# Patient Record
Sex: Female | Born: 1985 | Hispanic: No | Marital: Married | Smoking: Never smoker
Health system: Southern US, Community
[De-identification: ages and names within clinical notes are randomized; demographics above are authoritative.]

## PROBLEM LIST (undated history)

## (undated) DIAGNOSIS — O139 Gestational [pregnancy-induced] hypertension without significant proteinuria, unspecified trimester: Secondary | ICD-10-CM

## (undated) DIAGNOSIS — D649 Anemia, unspecified: Secondary | ICD-10-CM

## (undated) DIAGNOSIS — K8020 Calculus of gallbladder without cholecystitis without obstruction: Secondary | ICD-10-CM

## (undated) DIAGNOSIS — R569 Unspecified convulsions: Secondary | ICD-10-CM

## (undated) DIAGNOSIS — O24419 Gestational diabetes mellitus in pregnancy, unspecified control: Secondary | ICD-10-CM

## (undated) HISTORY — PX: EYE SURGERY: SHX253

---
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
## 2003-04-08 ENCOUNTER — Inpatient Hospital Stay (HOSPITAL_COMMUNITY): Admission: AD | Admit: 2003-04-08 | Discharge: 2003-04-08 | Payer: Self-pay | Admitting: *Deleted

## 2013-06-07 ENCOUNTER — Encounter (HOSPITAL_COMMUNITY): Payer: Self-pay

## 2013-06-07 ENCOUNTER — Inpatient Hospital Stay (HOSPITAL_COMMUNITY): Payer: Medicaid Other

## 2013-06-07 ENCOUNTER — Inpatient Hospital Stay (HOSPITAL_COMMUNITY)
Admission: AD | Admit: 2013-06-07 | Discharge: 2013-06-12 | DRG: 765 | Disposition: A | Discharge status: Home / Self Care | Payer: Medicaid Other | Source: Ambulatory Visit | Attending: Obstetrics and Gynecology | Admitting: Obstetrics and Gynecology

## 2013-06-07 DIAGNOSIS — O1414 Severe pre-eclampsia complicating childbirth: Principal | ICD-10-CM | POA: Diagnosis present

## 2013-06-07 DIAGNOSIS — Z98891 History of uterine scar from previous surgery: Secondary | ICD-10-CM

## 2013-06-07 DIAGNOSIS — O99814 Abnormal glucose complicating childbirth: Secondary | ICD-10-CM | POA: Diagnosis present

## 2013-06-07 DIAGNOSIS — O1490 Unspecified pre-eclampsia, unspecified trimester: Secondary | ICD-10-CM | POA: Diagnosis present

## 2013-06-07 DIAGNOSIS — O9902 Anemia complicating childbirth: Secondary | ICD-10-CM | POA: Diagnosis present

## 2013-06-07 DIAGNOSIS — D649 Anemia, unspecified: Secondary | ICD-10-CM | POA: Diagnosis present

## 2013-06-07 DIAGNOSIS — O34219 Maternal care for unspecified type scar from previous cesarean delivery: Secondary | ICD-10-CM | POA: Diagnosis present

## 2013-06-07 DIAGNOSIS — O321XX0 Maternal care for breech presentation, not applicable or unspecified: Secondary | ICD-10-CM | POA: Diagnosis present

## 2013-06-07 LAB — COMPREHENSIVE METABOLIC PANEL
ALBUMIN: 2.6 g/dL — AB (ref 3.5–5.2)
ALK PHOS: 108 U/L (ref 39–117)
ALT: 14 U/L (ref 0–35)
ALT: 16 U/L (ref 0–35)
AST: 16 U/L (ref 0–37)
AST: 17 U/L (ref 0–37)
Albumin: 3 g/dL — ABNORMAL LOW (ref 3.5–5.2)
Alkaline Phosphatase: 95 U/L (ref 39–117)
BUN: 6 mg/dL (ref 6–23)
BUN: 6 mg/dL (ref 6–23)
CALCIUM: 8.6 mg/dL (ref 8.4–10.5)
CO2: 19 mEq/L (ref 19–32)
CO2: 18 meq/L — AB (ref 19–32)
Calcium: 8.4 mg/dL (ref 8.4–10.5)
Chloride: 105 mEq/L (ref 96–112)
Chloride: 103 mEq/L (ref 96–112)
Creatinine, Ser: 0.39 mg/dL — ABNORMAL LOW (ref 0.50–1.10)
Creatinine, Ser: 0.49 mg/dL — ABNORMAL LOW (ref 0.50–1.10)
GFR calc Af Amer: >90 mL/min (ref >90)
GFR calc Af Amer: >90 mL/min (ref >90)
GFR calc non Af Amer: >90 mL/min (ref >90)
GLUCOSE: 169 mg/dL — AB (ref 70–99)
Glucose, Bld: 151 mg/dL — ABNORMAL HIGH (ref 70–99)
POTASSIUM: 4.1 meq/L (ref 3.7–5.3)
Potassium: 3.6 mEq/L — ABNORMAL LOW (ref 3.7–5.3)
SODIUM: 138 meq/L (ref 137–147)
SODIUM: 137 meq/L (ref 137–147)
TOTAL PROTEIN: 5.8 g/dL — AB (ref 6.0–8.3)
TOTAL PROTEIN: 6.7 g/dL (ref 6.0–8.3)
Total Bilirubin: <0.2 mg/dL — ABNORMAL LOW (ref 0.3–1.2)
Total Bilirubin: <0.2 mg/dL — ABNORMAL LOW (ref 0.3–1.2)

## 2013-06-07 LAB — PROTEIN / CREATININE RATIO, URINE
Creatinine, Urine: 30.97 mg/dL
PROTEIN CREATININE RATIO: 2.61 — AB (ref 0.00–0.15)
TOTAL PROTEIN, URINE: 80.9 mg/dL

## 2013-06-07 LAB — URINALYSIS, ROUTINE W REFLEX MICROSCOPIC
Bilirubin Urine: NEGATIVE
Glucose, UA: NEGATIVE mg/dL
Ketones, ur: NEGATIVE mg/dL
NITRITE: NEGATIVE
Protein, ur: 100 mg/dL — AB
SPECIFIC GRAVITY, URINE: 1.01 (ref 1.005–1.030)
UROBILINOGEN UA: 0.2 mg/dL (ref 0.0–1.0)
pH: 6.5 (ref 5.0–8.0)

## 2013-06-07 LAB — GLUCOSE, CAPILLARY
GLUCOSE-CAPILLARY: 122 mg/dL — AB (ref 70–99)
Glucose-Capillary: 90 mg/dL (ref 70–99)

## 2013-06-07 LAB — CBC
HEMATOCRIT: 32.4 % — AB (ref 36.0–46.0)
HEMATOCRIT: 34.7 % — AB (ref 36.0–46.0)
Hemoglobin: 10.9 g/dL — ABNORMAL LOW (ref 12.0–15.0)
Hemoglobin: 12 g/dL (ref 12.0–15.0)
MCH: 29.5 pg (ref 26.0–34.0)
MCH: 30.4 pg (ref 26.0–34.0)
MCHC: 33.6 g/dL (ref 30.0–36.0)
MCHC: 34.6 g/dL (ref 30.0–36.0)
MCV: 87.8 fL (ref 78.0–100.0)
MCV: 87.8 fL (ref 78.0–100.0)
Platelets: 186 10*3/uL (ref 150–400)
Platelets: 200 10*3/uL (ref 150–400)
RBC: 3.69 MIL/uL — ABNORMAL LOW (ref 3.87–5.11)
RBC: 3.95 MIL/uL (ref 3.87–5.11)
RDW: 12.5 % (ref 11.5–15.5)
RDW: 12.5 % (ref 11.5–15.5)
WBC: 5 10*3/uL (ref 4.0–10.5)
WBC: 7.7 10*3/uL (ref 4.0–10.5)

## 2013-06-07 LAB — URIC ACID
Uric Acid, Serum: 5.4 mg/dL (ref 2.4–7.0)
Uric Acid, Serum: 6.1 mg/dL (ref 2.4–7.0)

## 2013-06-07 LAB — LACTATE DEHYDROGENASE: LDH: 137 U/L (ref 94–250)

## 2013-06-07 LAB — TYPE AND SCREEN
ABO/RH(D): O POS
ANTIBODY SCREEN: NEGATIVE

## 2013-06-07 LAB — URINE MICROSCOPIC-ADD ON

## 2013-06-07 LAB — ABO/RH: ABO/RH(D): O POS

## 2013-06-07 MED ORDER — LABETALOL HCL 5 MG/ML IV SOLN
10.0000 mg | INTRAVENOUS | Status: DC | PRN
  Filled 2013-06-07: qty 4

## 2013-06-07 MED ORDER — ZOLPIDEM TARTRATE 5 MG PO TABS: 5.0000 mg | ORAL_TABLET | Freq: Every evening | ORAL | Status: DC | PRN

## 2013-06-07 MED ORDER — MAGNESIUM SULFATE 4000MG/100ML IJ SOLN
4.0000 g | Freq: Once | INTRAMUSCULAR | Status: DC
  Filled 2013-06-07: qty 100

## 2013-06-07 MED ORDER — DOCUSATE SODIUM 100 MG PO CAPS
100.0000 mg | ORAL_CAPSULE | Freq: Every day | ORAL | Status: DC
  Administered 2013-06-08: 100 mg via ORAL
  Filled 2013-06-07 (×3): qty 1

## 2013-06-07 MED ORDER — MAGNESIUM SULFATE BOLUS VIA INFUSION
4.0000 g | Freq: Once | INTRAVENOUS | Status: AC
  Administered 2013-06-07: 4 g via INTRAVENOUS
  Filled 2013-06-07: qty 500

## 2013-06-07 MED ORDER — LABETALOL HCL 5 MG/ML IV SOLN
10.0000 mg | INTRAVENOUS | Status: DC | PRN
  Administered 2013-06-07: 10 mg via INTRAVENOUS

## 2013-06-07 MED ORDER — ACETAMINOPHEN 325 MG PO TABS: 650.0000 mg | ORAL_TABLET | ORAL | Status: DC | PRN

## 2013-06-07 MED ORDER — PRENATAL MULTIVITAMIN CH
1.0000 | ORAL_TABLET | Freq: Every day | ORAL | Status: DC
  Administered 2013-06-08: 1 via ORAL
  Filled 2013-06-07 (×3): qty 1

## 2013-06-07 MED ORDER — INSULIN ASPART 100 UNIT/ML ~~LOC~~ SOLN
0.0000 [IU] | Freq: Three times a day (TID) | SUBCUTANEOUS | Status: DC
Start: 2013-06-07 — End: 2013-06-08

## 2013-06-07 MED ORDER — BETAMETHASONE SOD PHOS & ACET 6 (3-3) MG/ML IJ SUSP
12.0000 mg | INTRAMUSCULAR | Status: AC
  Administered 2013-06-07 – 2013-06-08 (×2): 12 mg via INTRAMUSCULAR
  Filled 2013-06-07 (×2): qty 2

## 2013-06-07 MED ORDER — CALCIUM CARBONATE ANTACID 500 MG PO CHEW
2.0000 | CHEWABLE_TABLET | ORAL | Status: DC | PRN
  Filled 2013-06-07: qty 2

## 2013-06-07 MED ORDER — LACTATED RINGERS IV SOLN
INTRAVENOUS | Status: DC
  Administered 2013-06-07 – 2013-06-09 (×3): via INTRAVENOUS

## 2013-06-07 MED ORDER — MAGNESIUM SULFATE 40 G IN LACTATED RINGERS - SIMPLE
2.0000 g/h | INTRAVENOUS | Status: DC
  Administered 2013-06-08 – 2013-06-09 (×2): 2 g/h via INTRAVENOUS
  Filled 2013-06-07 (×3): qty 500

## 2013-06-07 MED ORDER — INSULIN ASPART 100 UNIT/ML ~~LOC~~ SOLN: 0.0000 [IU] | Freq: Every day | SUBCUTANEOUS | Status: DC

## 2013-06-07 NOTE — Progress Notes (Signed)
Reviewed findings of severe pre-eclampsia and plan of care with patient and niece. Questions answered   Cesarean section also reviewed with pt with R&B including but not limited to:  bleeding, infection, injury to other organs. Low transverse approach planned which will allow vaginal delivery with future pregnancies. Should a vertical incision or inverted T be needed, patient is aware that repeat cesarean sections would be recommended in the future. Expected hospital stay and recovery also discussed.  Will follow closely with labs every 8 hours.

## 2013-06-07 NOTE — Consult Note (Signed)
MFM consult, Staff Note  Impressions:  1. Persistent severe range blood pressures with P:C ratio of 2.6 ---> severe preeclampsia 2. Breech presentation of fetus that is AGA (see [facility]) ? Summary of Recommendations: 1. Initiation of course of corticosteroids  2. Treat severe range blood pressures with antihypertensive IV (eg, labetalol)  3. CEFM  4. Initiate magnesium sulfate for maternal seizure prophylaxis and neonatal CP prophylaxis  5. Would assess this patient clinically frequently to detect evidence of either maternal or fetal deterioration, noting that expectant management would not be recommended if deterioration of either mom or fetus became apparent  6. Would assess LFTs/Cr/CBC every 8 hours at a minimum given patient's symptoms to facilitate early detection of maternal end organ failure. If Cr is at or above 1.2, delivery would be recommended. If platelet count becomes less than 100,000, delivery would be recommended. If patient becomes hypoglycemic and transaminitis becomes more progressive (eg, 5- to 10-fold current values)---would indicate acute fatty liver, I would recommend discontinuing expectant management. If maternal HTN is not amenable to control then I would recommend delivery.  7. If eclampsia is encountered, would recommend treatment of eclamptic seizure to stabilize the maternal-fetal unit. Then, once stabilization has been established, discontinue expectant management and deliver this patient.  8. Lastly, regardless of whether this mom and baby continue to remain stable, I would recommend delivery between 32 weeks weeks or shortly after steroid course is completed noting that my preference/recommendation would be for after completion of steroids, noting this remains contingent upon a reassuring fetal heart tracing and stable maternal condition.  I spent in excess of 30 minutes reviewing and evaluating this patient's case with greater than 50% of this time in face to  face discussion.  I spoke directly with Dr. Estanislado Pandyivard to relay my impression and recommendations.  Page with questions.  Merideth AbbeyJ. M. Denney, MD, MS, FACOG Assistant Professor, Maternal-Fetal Medicine

## 2013-06-07 NOTE — MAU Note (Signed)
Pt presents from office for PIH eval. Denies vaginal bleeding, discharge, and leaking of fluid. Reports good fetal movement.

## 2013-06-07 NOTE — H&P (Signed)
Katelyn Hinton is a 28 y.o. female, G1P0 at 31.6 weeks, presenting from office for elevated bp, pitting and facial edema, and HA.  Patient reports having headache for 3 days and attributes to allergies.  Patient also newly diagnosed with GDM.    Patient Active Problem List   Diagnosis Date Noted  . Preeclampsia 06/07/2013    History of present pregnancy: Patient entered care at 12.6 weeks.   EDC of 08/03/2013 was established by 12.6wk US on 01/25/2014.   Anatomy scan:  20.1weeks, with normal findings and an posterior placenta.   Additional US evaluations:  06/07/2013--Breech, Right lateral above cervical os Significant prenatal events:  GDM Last evaluation:  06/07/2013  OB History   Grav Para Term Preterm Abortions TAB SAB Ect Mult Living   1              History reviewed. No pertinent past medical history. Past Surgical History  Procedure Laterality Date  . Eye surgery     Family History: family history includes Arthritis in her father; Diabetes in her father; Hypertension in her father and mother. Social History:  reports that she has never smoked. She does not have any smokeless tobacco history on file. She reports that she does not drink alcohol or use illicit drugs.   Prenatal Transfer Tool  Maternal Diabetes: Yes:  Diabetes Type:  Diet controlled Genetic Screening: Normal Maternal Ultrasounds/Referrals: Normal Fetal Ultrasounds or other Referrals:  None Maternal Substance Abuse:  No Significant Maternal Medications:  None Significant Maternal Lab Results: None  ROS:  WNL  No Known Allergies    Blood pressure 161/104, pulse 92, temperature 98.1 F (36.7 C), resp. rate 18, SpO2 100.00%.  Chest clear Heart RRR without murmur Abd gravid, NT Ext: WNL  FHR: Cat I  UCs:  Irregular, mild  Prenatal labs: ABO, Rh:  O Positive Antibody:  Negative Rubella:   Immune RPR:   NR HBsAg:   Negtive HIV:   NR GBS:  Not Obtained Sickle cell/Hgb electrophoresis:  Normal   Pap:  WNL 01/2013 GC:  Negative Chlamydia:  Negative Genetic screenings:   Glucola:  Positive-GDM-Diet Controlled Other:  PC Ratio-2.61    Assessment IUP at 31.6wks PreEclampsia GDM-New Diagnosed  Plan: Admit to antenatal per consult with Dr. Kathie RhodesS. Rivard  Start betamethasone dosage CBGs 4x daily with insulin sliding scale MFM Consult with US IV Labetalol order Continuous monitoring Will modify POC based on patient status and MFM recommendation   Peterson LombardJessica EmlyCNM, MSN 06/07/2013, 3:00 PM

## 2013-06-07 NOTE — Plan of Care (Signed)
Problem: Consults Goal: Birthing Suites Patient Information Press F2 to bring up selections list   Pt < [redacted] weeks EGA     

## 2013-06-07 NOTE — Consult Note (Signed)
Neonatology Consult to Antenatal Patient:  I was asked by Dr. Estanislado Pandyivard to see this patient in order to provide antenatal counseling due to severe pre-eclampsia.  [name] was admitted this afternoon at 31 2/[redacted] weeks GA due to severe pre-eclampsia. She is also an insulin-dependent GDM. She is currently not having active labor. She is getting BMZ and Magnesium sulfate. This is her first baby; the infant is AGA and is female. MFM is following.  I spoke with the patient and her neice. We discussed the worst case of delivery in the next 1-2 days, including usual DR management, possible respiratory complications and need for support, IV access, feedings (mother desires breast feeding, which was encouraged), LOS, Mortality and Morbidity, and long term outcomes. She is quite anxious and does not have any questions at this time. I offered a NICU tour to any interested family members and would be glad to come back if she has more questions later.  Thank you for asking me to see this patient.  Doretha Souhristie C. Adolphe Fortunato, MD Neonatologist  The total length of face-to-face or floor/unit time for this encounter was 20 minutes. Counseling and/or coordination of care was 15 minutes of the above.

## 2013-06-08 LAB — COMPREHENSIVE METABOLIC PANEL
ALBUMIN: 2.7 g/dL — AB (ref 3.5–5.2)
ALBUMIN: 3.2 g/dL — AB (ref 3.5–5.2)
ALK PHOS: 96 U/L (ref 39–117)
ALT: 15 U/L (ref 0–35)
ALT: 17 U/L (ref 0–35)
ALT: 17 U/L (ref 0–35)
AST: 17 U/L (ref 0–37)
AST: 19 U/L (ref 0–37)
AST: 17 U/L (ref 0–37)
Albumin: 2.7 g/dL — ABNORMAL LOW (ref 3.5–5.2)
Alkaline Phosphatase: 97 U/L (ref 39–117)
Alkaline Phosphatase: 99 U/L (ref 39–117)
BUN: 7 mg/dL (ref 6–23)
BUN: 6 mg/dL (ref 6–23)
BUN: 8 mg/dL (ref 6–23)
CALCIUM: 8 mg/dL — AB (ref 8.4–10.5)
CALCIUM: 7.6 mg/dL — AB (ref 8.4–10.5)
CO2: 19 meq/L (ref 19–32)
CO2: 18 mEq/L — ABNORMAL LOW (ref 19–32)
CO2: 19 meq/L (ref 19–32)
CREATININE: 0.43 mg/dL — AB (ref 0.50–1.10)
Calcium: 8.1 mg/dL — ABNORMAL LOW (ref 8.4–10.5)
Chloride: 103 mEq/L (ref 96–112)
Chloride: 103 mEq/L (ref 96–112)
Chloride: 103 mEq/L (ref 96–112)
Creatinine, Ser: 0.44 mg/dL — ABNORMAL LOW (ref 0.50–1.10)
Creatinine, Ser: 0.45 mg/dL — ABNORMAL LOW (ref 0.50–1.10)
GFR calc Af Amer: >90 mL/min (ref >90)
GFR calc non Af Amer: >90 mL/min (ref >90)
GFR calc non Af Amer: >90 mL/min (ref >90)
GLUCOSE: 181 mg/dL — AB (ref 70–99)
Glucose, Bld: 130 mg/dL — ABNORMAL HIGH (ref 70–99)
Glucose, Bld: 121 mg/dL — ABNORMAL HIGH (ref 70–99)
POTASSIUM: 4.3 meq/L (ref 3.7–5.3)
POTASSIUM: 4.2 meq/L (ref 3.7–5.3)
Potassium: 4.5 mEq/L (ref 3.7–5.3)
SODIUM: 136 meq/L — AB (ref 137–147)
SODIUM: 137 meq/L (ref 137–147)
Sodium: 136 mEq/L — ABNORMAL LOW (ref 137–147)
TOTAL PROTEIN: 6.4 g/dL (ref 6.0–8.3)
Total Bilirubin: <0.2 mg/dL — ABNORMAL LOW (ref 0.3–1.2)
Total Bilirubin: 0.3 mg/dL (ref 0.3–1.2)
Total Bilirubin: <0.2 mg/dL — ABNORMAL LOW (ref 0.3–1.2)
Total Protein: 6 g/dL (ref 6.0–8.3)
Total Protein: 6.3 g/dL (ref 6.0–8.3)

## 2013-06-08 LAB — GLUCOSE, CAPILLARY
Glucose-Capillary: 108 mg/dL — ABNORMAL HIGH (ref 70–99)
Glucose-Capillary: 112 mg/dL — ABNORMAL HIGH (ref 70–99)
Glucose-Capillary: 174 mg/dL — ABNORMAL HIGH (ref 70–99)
Glucose-Capillary: 176 mg/dL — ABNORMAL HIGH (ref 70–99)

## 2013-06-08 LAB — CBC
HCT: 34.1 % — ABNORMAL LOW (ref 36.0–46.0)
HCT: 31 % — ABNORMAL LOW (ref 36.0–46.0)
HEMATOCRIT: 32.3 % — AB (ref 36.0–46.0)
HEMOGLOBIN: 10.1 g/dL — AB (ref 12.0–15.0)
Hemoglobin: 11 g/dL — ABNORMAL LOW (ref 12.0–15.0)
Hemoglobin: 11.4 g/dL — ABNORMAL LOW (ref 12.0–15.0)
MCH: 29.7 pg (ref 26.0–34.0)
MCH: 29.5 pg (ref 26.0–34.0)
MCH: 28.7 pg (ref 26.0–34.0)
MCHC: 34.1 g/dL (ref 30.0–36.0)
MCHC: 33.4 g/dL (ref 30.0–36.0)
MCHC: 32.6 g/dL (ref 30.0–36.0)
MCV: 87.3 fL (ref 78.0–100.0)
MCV: 88.1 fL (ref 78.0–100.0)
MCV: 88.1 fL (ref 78.0–100.0)
Platelets: 217 10*3/uL (ref 150–400)
Platelets: 221 10*3/uL (ref 150–400)
Platelets: 209 10*3/uL (ref 150–400)
RBC: 3.7 MIL/uL — AB (ref 3.87–5.11)
RBC: 3.87 MIL/uL (ref 3.87–5.11)
RBC: 3.52 MIL/uL — ABNORMAL LOW (ref 3.87–5.11)
RDW: 12.6 % (ref 11.5–15.5)
RDW: 12.7 % (ref 11.5–15.5)
RDW: 12.8 % (ref 11.5–15.5)
WBC: 6.4 10*3/uL (ref 4.0–10.5)
WBC: 7.8 10*3/uL (ref 4.0–10.5)
WBC: 7.2 10*3/uL (ref 4.0–10.5)

## 2013-06-08 LAB — URIC ACID
URIC ACID, SERUM: 5.8 mg/dL (ref 2.4–7.0)
Uric Acid, Serum: 6 mg/dL (ref 2.4–7.0)
Uric Acid, Serum: 6 mg/dL (ref 2.4–7.0)

## 2013-06-08 MED ORDER — INSULIN ASPART 100 UNIT/ML ~~LOC~~ SOLN
0.0000 [IU] | Freq: Three times a day (TID) | SUBCUTANEOUS | Status: DC
  Administered 2013-06-08: 4 [IU] via SUBCUTANEOUS

## 2013-06-08 MED ORDER — SODIUM CHLORIDE 0.9 % IV SOLN
INTRAVENOUS | Status: DC
  Administered 2013-06-09: 0.8 [IU]/h via INTRAVENOUS
  Filled 2013-06-08: qty 1

## 2013-06-08 MED ORDER — LACTATED RINGERS IV BOLUS (SEPSIS)
300.0000 mL | Freq: Once | INTRAVENOUS | Status: AC
  Administered 2013-06-08: 300 mL via INTRAVENOUS

## 2013-06-08 MED ORDER — INSULIN ASPART 100 UNIT/ML ~~LOC~~ SOLN: 0.0000 [IU] | Freq: Three times a day (TID) | SUBCUTANEOUS | Status: DC

## 2013-06-08 MED ORDER — DEXTROSE IN LACTATED RINGERS 5 % IV SOLN
INTRAVENOUS | Status: DC
  Administered 2013-06-09: 03:00:00 via INTRAVENOUS

## 2013-06-08 NOTE — Progress Notes (Addendum)
Antenatal Nutrition Assessment:  Currently  [redacted] weeks gestation, with severe preeclampsia. Height  61 "  Weight 176 lbs  pre-pregnancy weight 163 lbs .  Pre-pregnancy  BMI 30.8  IBW 105 lbs Total weight gain 13.lbs Weight gain goals 11-20 lbs Estimated needs: 17-1900 kcal/day, 59-69 grams protein/day, 2 liters fluid/day  Gestational carbohydrate modified diet  Current diet prescription will provide for increased needs.  Nutrition related labs . Betamethasone w/ insulin coverage for elevated CBG's CBG (last 3)   Recent Labs  06/07/13 1827 06/07/13 2213  GLUCAP 90 122*      Nutrition Dx: Increased nutrient needs r/t pregnancy and fetal growth requirements aeb [redacted] weeks gestation.  No educational needs assessed at this time.  Elisabeth CaraKatherine Brigham M.Odis LusterEd. R.D. LDN Neonatal Nutrition Support Specialist Pager [phone] Continue current care.  Plan for cesarean in the am

## 2013-06-08 NOTE — Progress Notes (Addendum)
Hospital day # 1 pregnancy at [redacted]w[redacted]d--Severe pre-eclampsia, breech presentation, gestational diabetes.  S:  Slept poorly, but denies HA, visual sx, or epigastric pain.  No SOB or chest         Pain.  Tolerating magnesium without difficulty .       Reports feels better than prior to admission.      Perception of contractions: None      Vaginal bleeding: None      Vaginal discharge:  None  O: BP 126/77  Pulse 101  Temp(Src) 98.1 F (36.7 C) (Oral)  Resp 18  Ht 5\' 1"  (1.549 m)  Wt 176 lb (79.833 kg)  BMI 33.27 kg/m2  SpO2 100%      Fetal tracings:  Category 1      Contractions:   None      Uterus gravid and non-tender      Extremities: DTR 1+, no clonus, 1-2+ edema in LE/feet  BP range since [place]:  126-134/76-88  BP range from 5pm to MN:  134-162/89-98          Labs:  Results for orders placed during the hospital encounter of 06/07/13 (from the past 24 hour(s))  URINALYSIS, ROUTINE W REFLEX MICROSCOPIC     Status: Abnormal   Collection Time    06/07/13 11:35 AM      Result Value Ref Range   Color, Urine YELLOW  YELLOW   APPearance CLEAR  CLEAR   Specific Gravity, Urine 1.010  1.005 - 1.030   pH 6.5  5.0 - 8.0   Glucose, UA NEGATIVE  NEGATIVE mg/dL   Hgb urine dipstick TRACE (*) NEGATIVE   Bilirubin Urine NEGATIVE  NEGATIVE   Ketones, ur NEGATIVE  NEGATIVE mg/dL   Protein, ur 161 (*) NEGATIVE mg/dL   Urobilinogen, UA 0.2  0.0 - 1.0 mg/dL   Nitrite NEGATIVE  NEGATIVE   Leukocytes, UA MODERATE (*) NEGATIVE  PROTEIN / CREATININE RATIO, URINE     Status: Abnormal   Collection Time    06/07/13 11:35 AM      Result Value Ref Range   Creatinine, Urine 30.97     Total Protein, Urine 80.9     PROTEIN CREATININE RATIO 2.61 (*) 0.00 - 0.15  URINE MICROSCOPIC-ADD ON     Status: Abnormal   Collection Time    06/07/13 11:35 AM      Result Value Ref Range   Squamous Epithelial / LPF FEW (*) RARE   WBC, UA 7-10  <3 WBC/hpf   RBC / HPF 3-6  <3 RBC/hpf   Bacteria, UA RARE  RARE   COMPREHENSIVE METABOLIC PANEL     Status: Abnormal   Collection Time    06/07/13 11:56 AM      Result Value Ref Range   Sodium 138  137 - 147 mEq/L   Potassium 3.6 (*) 3.7 - 5.3 mEq/L   Chloride 105  96 - 112 mEq/L   CO2 19  19 - 32 mEq/L   Glucose, Bld 151 (*) 70 - 99 mg/dL   BUN 6  6 - 23 mg/dL   Creatinine, Ser 0.96 (*) 0.50 - 1.10 mg/dL   Calcium 8.4  8.4 - 04.5 mg/dL   Total Protein 5.8 (*) 6.0 - 8.3 g/dL   Albumin 2.6 (*) 3.5 - 5.2 g/dL   AST 16  0 - 37 U/L   ALT 14  0 - 35 U/L   Alkaline Phosphatase 95  39 - 117 U/L   Total  Bilirubin <0.2 (*) 0.3 - 1.2 mg/dL   GFR calc non Af Amer >90  >90 mL/min   GFR calc Af Amer >90  >90 mL/min  CBC     Status: Abnormal   Collection Time    06/07/13 11:56 AM      Result Value Ref Range   WBC 5.0  4.0 - 10.5 K/uL   RBC 3.69 (*) 3.87 - 5.11 MIL/uL   Hemoglobin 10.9 (*) 12.0 - 15.0 g/dL   HCT 16.1 (*) 09.6 - 04.5 %   MCV 87.8  78.0 - 100.0 fL   MCH 29.5  26.0 - 34.0 pg   MCHC 33.6  30.0 - 36.0 g/dL   RDW 40.9  81.1 - 91.4 %   Platelets 186  150 - 400 K/uL  URIC ACID     Status: None   Collection Time    06/07/13 11:56 AM      Result Value Ref Range   Uric Acid, Serum 5.4  2.4 - 7.0 mg/dL  LACTATE DEHYDROGENASE     Status: None   Collection Time    06/07/13 11:56 AM      Result Value Ref Range   LDH 137  94 - 250 U/L  GLUCOSE, CAPILLARY     Status: None   Collection Time    06/07/13  6:27 PM      Result Value Ref Range   Glucose-Capillary 90  70 - 99 mg/dL  CBC     Status: Abnormal   Collection Time    06/07/13  8:05 PM      Result Value Ref Range   WBC 7.7  4.0 - 10.5 K/uL   RBC 3.95  3.87 - 5.11 MIL/uL   Hemoglobin 12.0  12.0 - 15.0 g/dL   HCT 78.2 (*) 95.6 - 21.3 %   MCV 87.8  78.0 - 100.0 fL   MCH 30.4  26.0 - 34.0 pg   MCHC 34.6  30.0 - 36.0 g/dL   RDW 08.6  57.8 - 46.9 %   Platelets 200  150 - 400 K/uL  COMPREHENSIVE METABOLIC PANEL     Status: Abnormal   Collection Time    06/07/13  8:05 PM      Result  Value Ref Range   Sodium 137  137 - 147 mEq/L   Potassium 4.1  3.7 - 5.3 mEq/L   Chloride 103  96 - 112 mEq/L   CO2 18 (*) 19 - 32 mEq/L   Glucose, Bld 169 (*) 70 - 99 mg/dL   BUN 6  6 - 23 mg/dL   Creatinine, Ser 6.29 (*) 0.50 - 1.10 mg/dL   Calcium 8.6  8.4 - 52.8 mg/dL   Total Protein 6.7  6.0 - 8.3 g/dL   Albumin 3.0 (*) 3.5 - 5.2 g/dL   AST 17  0 - 37 U/L   ALT 16  0 - 35 U/L   Alkaline Phosphatase 108  39 - 117 U/L   Total Bilirubin <0.2 (*) 0.3 - 1.2 mg/dL   GFR calc non Af Amer >90  >90 mL/min   GFR calc Af Amer >90  >90 mL/min  URIC ACID     Status: None   Collection Time    06/07/13  8:05 PM      Result Value Ref Range   Uric Acid, Serum 6.1  2.4 - 7.0 mg/dL  TYPE AND SCREEN     Status: None   Collection Time    06/07/13  8:05 PM      Result Value Ref Range   ABO/RH(D) O POS     Antibody Screen NEG     Sample Expiration 06/10/2013    ABO/RH     Status: None   Collection Time    06/07/13  8:05 PM      Result Value Ref Range   ABO/RH(D) O POS    GLUCOSE, CAPILLARY     Status: Abnormal   Collection Time    06/07/13 10:13 PM      Result Value Ref Range   Glucose-Capillary 122 (*) 70 - 99 mg/dL  CBC     Status: Abnormal   Collection Time    06/08/13  2:55 AM      Result Value Ref Range   WBC 6.4  4.0 - 10.5 K/uL   RBC 3.70 (*) 3.87 - 5.11 MIL/uL   Hemoglobin 11.0 (*) 12.0 - 15.0 g/dL   HCT 16.132.3 (*) 09.636.0 - 04.546.0 %   MCV 87.3  78.0 - 100.0 fL   MCH 29.7  26.0 - 34.0 pg   MCHC 34.1  30.0 - 36.0 g/dL   RDW 40.912.6  81.111.5 - 91.415.5 %   Platelets 217  150 - 400 K/uL  COMPREHENSIVE METABOLIC PANEL     Status: Abnormal   Collection Time    06/08/13  2:55 AM      Result Value Ref Range   Sodium 136 (*) 137 - 147 mEq/L   Potassium 4.5  3.7 - 5.3 mEq/L   Chloride 103  96 - 112 mEq/L   CO2 19  19 - 32 mEq/L   Glucose, Bld 130 (*) 70 - 99 mg/dL   BUN 7  6 - 23 mg/dL   Creatinine, Ser 7.820.43 (*) 0.50 - 1.10 mg/dL   Calcium 8.0 (*) 8.4 - 10.5 mg/dL   Total Protein 6.0  6.0  - 8.3 g/dL   Albumin 2.7 (*) 3.5 - 5.2 g/dL   AST 17  0 - 37 U/L   ALT 15  0 - 35 U/L   Alkaline Phosphatase 97  39 - 117 U/L   Total Bilirubin <0.2 (*) 0.3 - 1.2 mg/dL   GFR calc non Af Amer >90  >90 mL/min   GFR calc Af Amer >90  >90 mL/min  URIC ACID     Status: None   Collection Time    06/08/13  2:55 AM      Result Value Ref Range   Uric Acid, Serum 5.8  2.4 - 7.0 mg/dL    I/O:  24 hour balance +1425, output last shift 1200 cc.       Meds: Magnesium sulfate 2 g/hr--started with 4 gm bolus at 1710                 Betamethasone 1st dose at 1500 yesterday                 Sliding scale insulin--has not required administration                 Labetalol IV prn parameters--received 10 mg dose at 1647  A: [id number] with severe pre-eclampsia, gestational diabetes, breech presentation     Stable labs and BPs  P: Continue current plan of care      Upcoming tests/treatments:  PIH labs q 8 hours per MFM consult--next draw       at 1055      2nd dose betamethasone at 1500.  Continue q 8 hour PIH labs      CBGs currently AC and HS--will consult Dr. Normand Sloopillard re: timing (? FBS and 2        hour PC vs current plan).      Scheduled for primary C/S at 1pm tomorrow      Reviewed plan of care with patient, including plan for C/S tomorrow.             Discussed rationale for not performing external version, in light of severe       pre-eclampsia and pre-term gestation.      MDs will follow--will consult regarding timing of NPO, etc, in light of GDM.  Nigel BridgemanVicki Maxwel Meadowcroft CNM, [place] 06/08/2013 8:05 AM  Addendum: Per consult with Dr. Normand Sloopillard: CBGs FBS and 2 hour PC NPO after 3am 06/09/13 Initiate glucommander regimen when NPO  Nigel BridgemanVicki Jaquari Reckner, CNM 06/08/13 [phone]

## 2013-06-09 ENCOUNTER — Encounter (HOSPITAL_COMMUNITY): Payer: Self-pay

## 2013-06-09 ENCOUNTER — Inpatient Hospital Stay (HOSPITAL_COMMUNITY): Payer: Medicaid Other | Admitting: Registered Nurse

## 2013-06-09 ENCOUNTER — Encounter (HOSPITAL_COMMUNITY)
Admission: AD | Disposition: A | Discharge status: Home / Self Care | Payer: Self-pay | Source: Ambulatory Visit | Attending: Obstetrics and Gynecology

## 2013-06-09 ENCOUNTER — Encounter (HOSPITAL_COMMUNITY): Payer: Medicaid Other | Admitting: Registered Nurse

## 2013-06-09 LAB — RPR

## 2013-06-09 LAB — COMPREHENSIVE METABOLIC PANEL
ALK PHOS: 104 U/L (ref 39–117)
ALK PHOS: 73 U/L (ref 39–117)
ALT: 20 U/L (ref 0–35)
ALT: 17 U/L (ref 0–35)
ALT: 18 U/L (ref 0–35)
AST: 19 U/L (ref 0–37)
AST: 19 U/L (ref 0–37)
AST: 21 U/L (ref 0–37)
Albumin: 3 g/dL — ABNORMAL LOW (ref 3.5–5.2)
Albumin: 2.7 g/dL — ABNORMAL LOW (ref 3.5–5.2)
Albumin: 2.4 g/dL — ABNORMAL LOW (ref 3.5–5.2)
Alkaline Phosphatase: 86 U/L (ref 39–117)
BUN: 10 mg/dL (ref 6–23)
BUN: 10 mg/dL (ref 6–23)
BUN: 10 mg/dL (ref 6–23)
CHLORIDE: 103 meq/L (ref 96–112)
CHLORIDE: 104 meq/L (ref 96–112)
CHLORIDE: 103 meq/L (ref 96–112)
CO2: 19 meq/L (ref 19–32)
CO2: 22 meq/L (ref 19–32)
CO2: 23 mEq/L (ref 19–32)
CREATININE: 0.53 mg/dL (ref 0.50–1.10)
CREATININE: 0.43 mg/dL — AB (ref 0.50–1.10)
Calcium: 7.9 mg/dL — ABNORMAL LOW (ref 8.4–10.5)
Calcium: 7.3 mg/dL — ABNORMAL LOW (ref 8.4–10.5)
Calcium: 6.8 mg/dL — ABNORMAL LOW (ref 8.4–10.5)
Creatinine, Ser: 0.47 mg/dL — ABNORMAL LOW (ref 0.50–1.10)
GFR calc Af Amer: >90 mL/min (ref >90)
GFR calc Af Amer: >90 mL/min (ref >90)
Glucose, Bld: 173 mg/dL — ABNORMAL HIGH (ref 70–99)
Glucose, Bld: 103 mg/dL — ABNORMAL HIGH (ref 70–99)
Glucose, Bld: 97 mg/dL (ref 70–99)
Potassium: 4.3 mEq/L (ref 3.7–5.3)
Potassium: 4 mEq/L (ref 3.7–5.3)
Potassium: 4.1 mEq/L (ref 3.7–5.3)
SODIUM: 137 meq/L (ref 137–147)
Sodium: 139 mEq/L (ref 137–147)
Sodium: 138 mEq/L (ref 137–147)
TOTAL PROTEIN: 4.9 g/dL — AB (ref 6.0–8.3)
Total Bilirubin: <0.2 mg/dL — ABNORMAL LOW (ref 0.3–1.2)
Total Protein: 6.4 g/dL (ref 6.0–8.3)
Total Protein: 6.1 g/dL (ref 6.0–8.3)

## 2013-06-09 LAB — CBC
HCT: 32.8 % — ABNORMAL LOW (ref 36.0–46.0)
HCT: 30.2 % — ABNORMAL LOW (ref 36.0–46.0)
HCT: 24.6 % — ABNORMAL LOW (ref 36.0–46.0)
Hemoglobin: 10.8 g/dL — ABNORMAL LOW (ref 12.0–15.0)
Hemoglobin: 10.4 g/dL — ABNORMAL LOW (ref 12.0–15.0)
Hemoglobin: 8.3 g/dL — ABNORMAL LOW (ref 12.0–15.0)
MCH: 29.1 pg (ref 26.0–34.0)
MCH: 30.3 pg (ref 26.0–34.0)
MCH: 29.6 pg (ref 26.0–34.0)
MCHC: 32.9 g/dL (ref 30.0–36.0)
MCHC: 34.4 g/dL (ref 30.0–36.0)
MCHC: 33.7 g/dL (ref 30.0–36.0)
MCV: 88.4 fL (ref 78.0–100.0)
MCV: 88 fL (ref 78.0–100.0)
MCV: 87.9 fL (ref 78.0–100.0)
Platelets: 210 10*3/uL (ref 150–400)
Platelets: 190 10*3/uL (ref 150–400)
Platelets: 204 K/uL (ref 150–400)
RBC: 3.71 MIL/uL — AB (ref 3.87–5.11)
RBC: 3.43 MIL/uL — AB (ref 3.87–5.11)
RBC: 2.8 MIL/uL — ABNORMAL LOW (ref 3.87–5.11)
RDW: 12.8 % (ref 11.5–15.5)
RDW: 12.7 % (ref 11.5–15.5)
RDW: 12.8 % (ref 11.5–15.5)
WBC: 8.6 10*3/uL (ref 4.0–10.5)
WBC: 7.7 10*3/uL (ref 4.0–10.5)
WBC: 11.4 K/uL — ABNORMAL HIGH (ref 4.0–10.5)

## 2013-06-09 LAB — GLUCOSE, CAPILLARY
GLUCOSE-CAPILLARY: 112 mg/dL — AB (ref 70–99)
GLUCOSE-CAPILLARY: 92 mg/dL (ref 70–99)
GLUCOSE-CAPILLARY: 93 mg/dL (ref 70–99)
Glucose-Capillary: 140 mg/dL — ABNORMAL HIGH (ref 70–99)
Glucose-Capillary: 123 mg/dL — ABNORMAL HIGH (ref 70–99)
Glucose-Capillary: 111 mg/dL — ABNORMAL HIGH (ref 70–99)
Glucose-Capillary: 119 mg/dL — ABNORMAL HIGH (ref 70–99)
Glucose-Capillary: 118 mg/dL — ABNORMAL HIGH (ref 70–99)
Glucose-Capillary: 123 mg/dL — ABNORMAL HIGH (ref 70–99)
Glucose-Capillary: 101 mg/dL — ABNORMAL HIGH (ref 70–99)
Glucose-Capillary: 96 mg/dL (ref 70–99)
Glucose-Capillary: 89 mg/dL (ref 70–99)
Glucose-Capillary: 102 mg/dL — ABNORMAL HIGH (ref 70–99)
Glucose-Capillary: 91 mg/dL (ref 70–99)
Glucose-Capillary: 81 mg/dL (ref 70–99)
Glucose-Capillary: 83 mg/dL (ref 70–99)

## 2013-06-09 LAB — MAGNESIUM: Magnesium: 4.7 mg/dL — ABNORMAL HIGH (ref 1.5–2.5)

## 2013-06-09 LAB — URIC ACID
URIC ACID, SERUM: 5.6 mg/dL (ref 2.4–7.0)
Uric Acid, Serum: 5.9 mg/dL (ref 2.4–7.0)
Uric Acid, Serum: 5.8 mg/dL (ref 2.4–7.0)

## 2013-06-09 LAB — LACTATE DEHYDROGENASE: LDH: 258 U/L — ABNORMAL HIGH (ref 94–250)

## 2013-06-09 SURGERY — Surgical Case
Anesthesia: Spinal | Site: Abdomen

## 2013-06-09 MED ORDER — FENTANYL CITRATE 0.05 MG/ML IJ SOLN
25.0000 ug | INTRAMUSCULAR | Status: DC | PRN
  Administered 2013-06-09 (×4): 50 ug via INTRAVENOUS

## 2013-06-09 MED ORDER — DIPHENHYDRAMINE HCL 50 MG/ML IJ SOLN: 12.5000 mg | INTRAMUSCULAR | Status: DC | PRN

## 2013-06-09 MED ORDER — NALOXONE HCL 0.4 MG/ML IJ SOLN: 0.4000 mg | INTRAMUSCULAR | Status: DC | PRN

## 2013-06-09 MED ORDER — MEPERIDINE HCL 25 MG/ML IJ SOLN: 6.2500 mg | INTRAMUSCULAR | Status: DC | PRN

## 2013-06-09 MED ORDER — KETOROLAC TROMETHAMINE 30 MG/ML IJ SOLN: 30.0000 mg | Freq: Four times a day (QID) | INTRAMUSCULAR | Status: AC | PRN

## 2013-06-09 MED ORDER — MORPHINE SULFATE 0.5 MG/ML IJ SOLN
INTRAMUSCULAR | Status: AC
  Filled 2013-06-09: qty 10

## 2013-06-09 MED ORDER — NALBUPHINE HCL 10 MG/ML IJ SOLN: 5.0000 mg | INTRAMUSCULAR | Status: DC | PRN

## 2013-06-09 MED ORDER — FENTANYL CITRATE 0.05 MG/ML IJ SOLN
INTRAMUSCULAR | Status: AC
  Administered 2013-06-09: 50 ug via INTRAVENOUS
  Filled 2013-06-09: qty 2

## 2013-06-09 MED ORDER — TETANUS-DIPHTH-ACELL PERTUSSIS 5-2.5-18.5 LF-MCG/0.5 IM SUSP
0.5000 mL | Freq: Once | INTRAMUSCULAR | Status: DC
  Filled 2013-06-09 (×2): qty 0.5

## 2013-06-09 MED ORDER — BUPIVACAINE IN DEXTROSE 0.75-8.25 % IT SOLN
INTRATHECAL | Status: DC | PRN
  Administered 2013-06-09: 1.2 mL via INTRATHECAL

## 2013-06-09 MED ORDER — OXYTOCIN 40 UNITS IN LACTATED RINGERS INFUSION - SIMPLE MED: 62.5000 mL/h | INTRAVENOUS | Status: AC

## 2013-06-09 MED ORDER — ONDANSETRON HCL 4 MG/2ML IJ SOLN: 4.0000 mg | INTRAMUSCULAR | Status: DC | PRN

## 2013-06-09 MED ORDER — CHLOROPROCAINE HCL 3 % IJ SOLN
INTRAMUSCULAR | Status: DC | PRN
  Administered 2013-06-09: 20 mL

## 2013-06-09 MED ORDER — METOCLOPRAMIDE HCL 5 MG/ML IJ SOLN: 10.0000 mg | Freq: Three times a day (TID) | INTRAMUSCULAR | Status: DC | PRN

## 2013-06-09 MED ORDER — IBUPROFEN 600 MG PO TABS
600.0000 mg | ORAL_TABLET | Freq: Four times a day (QID) | ORAL | Status: DC
  Administered 2013-06-09 – 2013-06-12 (×11): 600 mg via ORAL
  Filled 2013-06-09 (×11): qty 1

## 2013-06-09 MED ORDER — SODIUM CHLORIDE 0.9 % IJ SOLN
3.0000 mL | INTRAMUSCULAR | Status: DC | PRN
  Administered 2013-06-10: 3 mL via INTRAVENOUS

## 2013-06-09 MED ORDER — SIMETHICONE 80 MG PO CHEW
80.0000 mg | CHEWABLE_TABLET | Freq: Three times a day (TID) | ORAL | Status: DC
Start: 2013-06-09 — End: 2013-06-12
  Administered 2013-06-10 – 2013-06-12 (×7): 80 mg via ORAL
  Filled 2013-06-09 (×8): qty 1

## 2013-06-09 MED ORDER — CHLOROPROCAINE HCL 3 % IJ SOLN
INTRAMUSCULAR | Status: AC
  Filled 2013-06-09: qty 20

## 2013-06-09 MED ORDER — OXYTOCIN 10 UNIT/ML IJ SOLN
INTRAMUSCULAR | Status: AC
  Filled 2013-06-09: qty 4

## 2013-06-09 MED ORDER — INSULIN ASPART 100 UNIT/ML ~~LOC~~ SOLN: 2.0000 [IU] | SUBCUTANEOUS | Status: DC

## 2013-06-09 MED ORDER — METOCLOPRAMIDE HCL 5 MG/ML IJ SOLN: 10.0000 mg | Freq: Once | INTRAMUSCULAR | Status: DC | PRN

## 2013-06-09 MED ORDER — PRENATAL MULTIVITAMIN CH
1.0000 | ORAL_TABLET | Freq: Every day | ORAL | Status: DC
  Administered 2013-06-10 – 2013-06-12 (×3): 1 via ORAL
  Filled 2013-06-09 (×3): qty 1

## 2013-06-09 MED ORDER — DIPHENHYDRAMINE HCL 50 MG/ML IJ SOLN: 25.0000 mg | INTRAMUSCULAR | Status: DC | PRN

## 2013-06-09 MED ORDER — DIBUCAINE 1 % RE OINT: 1.0000 "application " | TOPICAL_OINTMENT | RECTAL | Status: DC | PRN

## 2013-06-09 MED ORDER — ONDANSETRON HCL 4 MG/2ML IJ SOLN
INTRAMUSCULAR | Status: DC | PRN
  Administered 2013-06-09: 4 mg via INTRAVENOUS

## 2013-06-09 MED ORDER — DIPHENHYDRAMINE HCL 25 MG PO CAPS: 25.0000 mg | ORAL_CAPSULE | Freq: Four times a day (QID) | ORAL | Status: DC | PRN

## 2013-06-09 MED ORDER — ZOLPIDEM TARTRATE 5 MG PO TABS: 5.0000 mg | ORAL_TABLET | Freq: Every evening | ORAL | Status: DC | PRN

## 2013-06-09 MED ORDER — SIMETHICONE 80 MG PO CHEW
80.0000 mg | CHEWABLE_TABLET | ORAL | Status: DC
  Administered 2013-06-09 – 2013-06-10 (×2): 80 mg via ORAL
  Filled 2013-06-09: qty 1

## 2013-06-09 MED ORDER — MORPHINE SULFATE (PF) 0.5 MG/ML IJ SOLN
INTRAMUSCULAR | Status: DC | PRN
  Administered 2013-06-09 (×2): 2 mg via EPIDURAL
  Administered 2013-06-09: .9 mg via EPIDURAL

## 2013-06-09 MED ORDER — SENNOSIDES-DOCUSATE SODIUM 8.6-50 MG PO TABS
2.0000 | ORAL_TABLET | ORAL | Status: DC
  Administered 2013-06-09 – 2013-06-10 (×2): 2 via ORAL
  Filled 2013-06-09: qty 2

## 2013-06-09 MED ORDER — SIMETHICONE 80 MG PO CHEW: 80.0000 mg | CHEWABLE_TABLET | ORAL | Status: DC | PRN

## 2013-06-09 MED ORDER — WITCH HAZEL-GLYCERIN EX PADS: 1.0000 "application " | MEDICATED_PAD | CUTANEOUS | Status: DC | PRN

## 2013-06-09 MED ORDER — ONDANSETRON HCL 4 MG/2ML IJ SOLN: 4.0000 mg | Freq: Three times a day (TID) | INTRAMUSCULAR | Status: DC | PRN

## 2013-06-09 MED ORDER — CEFAZOLIN SODIUM-DEXTROSE 2-3 GM-% IV SOLR: 2.0000 g | Freq: Three times a day (TID) | INTRAVENOUS | Status: DC

## 2013-06-09 MED ORDER — FENTANYL CITRATE 0.05 MG/ML IJ SOLN
INTRAMUSCULAR | Status: DC | PRN
  Administered 2013-06-09: 50 ug via INTRAVENOUS
  Administered 2013-06-09: 35 ug via INTRAVENOUS

## 2013-06-09 MED ORDER — LACTATED RINGERS IV SOLN
INTRAVENOUS | Status: DC
  Administered 2013-06-09 – 2013-06-10 (×2): 100 mL/h via INTRAVENOUS

## 2013-06-09 MED ORDER — NALOXONE HCL 1 MG/ML IJ SOLN: 1.0000 ug/kg/h | INTRAVENOUS | Status: DC | PRN

## 2013-06-09 MED ORDER — OXYTOCIN 10 UNIT/ML IJ SOLN
40.0000 [IU] | INTRAVENOUS | Status: DC | PRN
  Administered 2013-06-09: 40 [IU] via INTRAVENOUS

## 2013-06-09 MED ORDER — ONDANSETRON HCL 4 MG PO TABS: 4.0000 mg | ORAL_TABLET | ORAL | Status: DC | PRN

## 2013-06-09 MED ORDER — LANOLIN HYDROUS EX OINT: 1.0000 "application " | TOPICAL_OINTMENT | CUTANEOUS | Status: DC | PRN

## 2013-06-09 MED ORDER — PHENYLEPHRINE 8 MG IN D5W 100 ML (0.08MG/ML) PREMIX OPTIME
INJECTION | INTRAVENOUS | Status: AC
  Filled 2013-06-09: qty 100

## 2013-06-09 MED ORDER — OXYCODONE-ACETAMINOPHEN 5-325 MG PO TABS: 1.0000 | ORAL_TABLET | ORAL | Status: DC | PRN

## 2013-06-09 MED ORDER — KETOROLAC TROMETHAMINE 60 MG/2ML IM SOLN: 60.0000 mg | Freq: Once | INTRAMUSCULAR | Status: AC | PRN

## 2013-06-09 MED ORDER — MENTHOL 3 MG MT LOZG: 1.0000 | LOZENGE | OROMUCOSAL | Status: DC | PRN

## 2013-06-09 MED ORDER — FENTANYL CITRATE 0.05 MG/ML IJ SOLN
INTRAMUSCULAR | Status: DC | PRN
  Administered 2013-06-09: 15 ug via INTRATHECAL

## 2013-06-09 MED ORDER — MORPHINE SULFATE (PF) 0.5 MG/ML IJ SOLN
INTRAMUSCULAR | Status: DC | PRN
  Administered 2013-06-09: .1 mg via INTRATHECAL

## 2013-06-09 MED ORDER — IBUPROFEN 600 MG PO TABS: 600.0000 mg | ORAL_TABLET | Freq: Four times a day (QID) | ORAL | Status: DC

## 2013-06-09 MED ORDER — ONDANSETRON HCL 4 MG/2ML IJ SOLN
INTRAMUSCULAR | Status: AC
  Filled 2013-06-09: qty 2

## 2013-06-09 MED ORDER — PHENYLEPHRINE 8 MG IN D5W 100 ML (0.08MG/ML) PREMIX OPTIME
INJECTION | INTRAVENOUS | Status: DC | PRN
  Administered 2013-06-09: 15 ug/min via INTRAVENOUS

## 2013-06-09 MED ORDER — SCOPOLAMINE 1 MG/3DAYS TD PT72: 1.0000 | MEDICATED_PATCH | Freq: Once | TRANSDERMAL | Status: DC

## 2013-06-09 MED ORDER — CEFAZOLIN SODIUM-DEXTROSE 2-3 GM-% IV SOLR
2.0000 g | INTRAVENOUS | Status: AC
  Administered 2013-06-09: 2 g via INTRAVENOUS
  Filled 2013-06-09: qty 50

## 2013-06-09 MED ORDER — LACTATED RINGERS IV SOLN
INTRAVENOUS | Status: DC | PRN
  Administered 2013-06-09: 14:00:00 via INTRAVENOUS

## 2013-06-09 MED ORDER — DIPHENHYDRAMINE HCL 25 MG PO CAPS: 25.0000 mg | ORAL_CAPSULE | ORAL | Status: DC | PRN

## 2013-06-09 MED ORDER — FENTANYL CITRATE 0.05 MG/ML IJ SOLN
INTRAMUSCULAR | Status: AC
  Filled 2013-06-09: qty 2

## 2013-06-09 SURGICAL SUPPLY — 40 items
ADH SKN CLS APL DERMABOND .7 (GAUZE/BANDAGES/DRESSINGS) ×1
APL SKNCLS STERI-STRIP NONHPOA (GAUZE/BANDAGES/DRESSINGS) ×1
BENZOIN TINCTURE PRP APPL 2/3 (GAUZE/BANDAGES/DRESSINGS) ×2 IMPLANT
CLAMP CORD UMBIL (MISCELLANEOUS) IMPLANT
CLOTH BEACON ORANGE TIMEOUT ST (SAFETY) ×2 IMPLANT
CONTAINER PREFILL 10% NBF 15ML (MISCELLANEOUS) IMPLANT
DERMABOND ADVANCED (GAUZE/BANDAGES/DRESSINGS) ×1
DERMABOND ADVANCED .7 DNX12 (GAUZE/BANDAGES/DRESSINGS) IMPLANT
DRAIN JACKSON PRT FLT 7MM (DRAIN) ×1 IMPLANT
DRAPE LG THREE QUARTER DISP (DRAPES) IMPLANT
DRSG OPSITE POSTOP 4X10 (GAUZE/BANDAGES/DRESSINGS) ×2 IMPLANT
DURAPREP 26ML APPLICATOR (WOUND CARE) ×2 IMPLANT
ELECT REM PT RETURN 9FT ADLT (ELECTROSURGICAL) ×2
ELECTRODE REM PT RTRN 9FT ADLT (ELECTROSURGICAL) ×1 IMPLANT
EVACUATOR SILICONE 100CC (DRAIN) ×1 IMPLANT
EXTRACTOR VACUUM M CUP 4 TUBE (SUCTIONS) IMPLANT
GLOVE BIO SURGEON STRL SZ7.5 (GLOVE) ×2 IMPLANT
GLOVE BIOGEL PI IND STRL 7.5 (GLOVE) ×1 IMPLANT
GLOVE BIOGEL PI INDICATOR 7.5 (GLOVE) ×1
GOWN STRL REUS W/TWL LRG LVL3 (GOWN DISPOSABLE) ×4 IMPLANT
KIT ABG SYR 3ML LUER SLIP (SYRINGE) IMPLANT
NDL HYPO 25X5/8 SAFETYGLIDE (NEEDLE) IMPLANT
NEEDLE HYPO 25X5/8 SAFETYGLIDE (NEEDLE) IMPLANT
NS IRRIG 1000ML POUR BTL (IV SOLUTION) ×2 IMPLANT
PACK C SECTION WH (CUSTOM PROCEDURE TRAY) ×2 IMPLANT
PAD OB MATERNITY 4.3X12.25 (PERSONAL CARE ITEMS) ×2 IMPLANT
RETRACTOR WND ALEXIS 25 LRG (MISCELLANEOUS) ×1 IMPLANT
RTRCTR WOUND ALEXIS 25CM LRG (MISCELLANEOUS) ×2
STRIP CLOSURE SKIN 1/2X4 (GAUZE/BANDAGES/DRESSINGS) ×2 IMPLANT
SUT CHROMIC 2 0 CT 1 (SUTURE) ×2 IMPLANT
SUT MNCRL AB 3-0 PS2 27 (SUTURE) ×2 IMPLANT
SUT PLAIN 0 NONE (SUTURE) IMPLANT
SUT PLAIN 2 0 XLH (SUTURE) ×2 IMPLANT
SUT SILK 2 0 FS (SUTURE) ×1 IMPLANT
SUT VIC AB 0 CT1 36 (SUTURE) ×2 IMPLANT
SUT VIC AB 0 CTX 36 (SUTURE) ×6
SUT VIC AB 0 CTX36XBRD ANBCTRL (SUTURE) ×3 IMPLANT
TOWEL OR 17X24 6PK STRL BLUE (TOWEL DISPOSABLE) ×2 IMPLANT
TRAY FOLEY CATH 14FR (SET/KITS/TRAYS/PACK) ×2 IMPLANT
WATER STERILE IRR 1000ML POUR (IV SOLUTION) ×2 IMPLANT

## 2013-06-09 NOTE — Interval H&P Note (Signed)
History and Physical Interval Note:  06/09/2013 1:12 PM  Katelyn Hinton  has presented today for surgery, with the diagnosis of 32 Weeks, Maternal Fetal Medicine recomendation  The various methods of treatment have been discussed with the patient and family. After consideration of risks, benefits and other options for treatment, the patient has consented to  Procedure(s): CESAREAN SECTION (N/A) as a surgical intervention .  The patient's history has been reviewed, patient examined, no change in status, stable for surgery.  I have reviewed the patient's chart and labs.  Questions were answered to the patient's satisfaction.     Purcell NailsAngela Y Reegan Mctighe

## 2013-06-09 NOTE — Op Note (Addendum)
Cesarean Section Procedure Note  Indications: P1 at 32 1/7 wks scheduled for c-section secondary to severe preeclampsia and breech with recommendation from MFM for delivery.  Pre-operative Diagnosis: 32 Weeks, Maternal Fetal Medicine recomendation   Post-operative Diagnosis: 32 Weeks, Maternal Fetal Medicine recomendation  Procedure: CESAREAN SECTION  Surgeon: Osborn CohoAngela Jaeleah Smyser, MD    Assistants: Gerrit HeckJessica Emly, CNM  Anesthesia: Regional  Procedure Details  The patient was taken to the operating room secondary to severe preeclampsia and breech after the risks, benefits, complications, treatment options, and expected outcomes were discussed with the patient.  The patient concurred with the proposed plan, giving informed consent which was signed and witnessed. The patient was taken to Operating Room C-Section Suite, identified as Pearson Grippesha Oppenheimer and the procedure verified as C-Section Delivery. A Time Out was held and the above information confirmed.  After induction of anesthesia by obtaining a spinal, the patient was prepped and draped in the usual sterile manner. A Pfannenstiel skin incision was made and carried down through the subcutaneous tissue to the underlying layer of fascia.  The fascia was incised bilaterally and extended transversely bilaterally with the Mayo scissors. Kocher clamps were placed on the inferior aspect of the fascial incision and the underlying rectus muscle was separated from the fascia. The same was done on the superior aspect of the fascial incision.  The peritoneum was identified, entered bluntly and extended manually.  An Alexis self-retaining retractor was placed.  The utero-vesical peritoneal reflection was incised transversely and the bladder flap was bluntly freed from the lower uterine segment. A low transverse uterine incision was made with the scalpel and extended bilaterally with the bandage scissors.  The infant was delivered in breech position without  difficulty.  After the umbilical cord was clamped and cut, the infant was handed to the awaiting pediatricians.  Cord blood was obtained for evaluation.  The placenta was removed intact and appeared to be within normal limits. The uterus was cleared of all clots and debris. The uterine incision was closed with running interlocking sutures of 0 Vicryl and a second imbricating layer was performed as well.   Bilateral tubes and ovaries appeared to be within normal limits.  Good hemostasis was noted.  Copious irrigation was performed until clear.  The peritoneum was repaired with 2-0 chromic via a running suture.  The fascia was reapproximated with a running suture of 0 Vicryl.  A JP drain was placed and sutured in with 2-0 silk.  The subcutaneous tissue was reapproximated with 3 interrupted sutures of 2-0 plain.  The skin was reapproximated with a subcuticular suture of 3-0 monocryl.  Steristrips were applied.  Instrument, sponge, and needle counts were correct prior to abdominal closure and at the conclusion of the case.  The patient was awaiting transfer to the recovery room in good condition.  Findings: Live female infant with Apgars 7 at one minute and 8 at five minutes.  Normal appearing bilateral ovaries and fallopian tubes were noted.  Estimated Blood Loss:  700 ml         Drains: foley to gravity 200 cc         Total IV Fluids: 300 ml         Specimens to Pathology: Placenta         Complications:  None; patient tolerated the procedure well.         Disposition: PACU - hemodynamically stable.         Condition: stable  Attending Attestation: I performed the  procedure.

## 2013-06-09 NOTE — Progress Notes (Signed)
FHR 125

## 2013-06-09 NOTE — H&P (View-Only) (Signed)
.   Katelyn Hinton is a 28 y.o. G1P0 at [redacted]w[redacted]d admitted for Severe Preeclampsia  Subjective: No complaints.  Pt anticipating delivery today and is a little nervous.  Objective: BP 126/67  Pulse 80  Temp(Src) 98.9 F (37.2 C) (Oral)  Resp 18  Ht 5' 1" (1.549 m)  Wt 79.833 kg (176 lb)  BMI 33.27 kg/m2  SpO2 100% I/O last 3 completed shifts: In: 5879.4 [P.O.:2210; I.V.:3369.4; IV Piggyback:300] Out: 3950 [Urine:3950]    Physical Exam:  Gen: alert Chest/Lungs: cta bilaterally  Heart/Pulse: RRR  Abdomen: soft, gravid, nontender Uterine fundus: soft, nontender Skin & Color: warm and dry  EXT: negative Homan's b/l  FHT:  Cat 1 UC:   none SVE:    deferred  Bedside u/s - breech  Labs: Lab Results  Component Value Date   WBC 7.7 06/09/2013   HGB 10.4* 06/09/2013   HCT 30.2* 06/09/2013   MCV 88.0 06/09/2013   PLT 190 06/09/2013    Assessment and Plan: P0 at 32 1/7 wks scheduled for c/s secondary to severe preeclampsia per MFM recommendations to be done following course of steroids and Breech presentation.  R/B/A reviewed with the patient, patient verbalized understanding and consent signed and witnessed.  Fetal status is reassuring.  Pt previously spoke with Dr. Devanzo and understands baby will go to NICU.  Glucomander for glucose control and continuing mg sulfate after delivery.   Clelia Trabucco Y Otniel Hoe 06/09/2013, 1:04 PM  

## 2013-06-09 NOTE — Transfer of Care (Signed)
Immediate Anesthesia Transfer of Care Note  Patient: Katelyn Hinton  Procedure(s) Performed: Procedure(s): CESAREAN SECTION (N/A)  Patient Location: PACU  Anesthesia Type:Spinal  Level of Consciousness: sedated  Airway & Oxygen Therapy: Patient Spontanous Breathing  Post-op Assessment: Report given to PACU RN  Post vital signs: Reviewed and stable  Complications: No apparent anesthesia complications

## 2013-06-09 NOTE — Anesthesia Procedure Notes (Addendum)
Spinal  Patient location during procedure: OR Start time: 06/09/2013 12:53 PM Staffing Performed by: anesthesiologist  Preanesthetic Checklist Completed: patient identified, site marked, surgical consent, pre-op evaluation, timeout performed, IV checked, risks and benefits discussed and monitors and equipment checked Spinal Block Patient position: sitting Prep: site prepped and draped and DuraPrep Patient monitoring: heart rate, cardiac monitor, continuous pulse ox and blood pressure Approach: midline Location: L3-4 Injection technique: single-shot Needle Needle type: Pencan  Needle gauge: 24 G Needle length: 9 cm Assessment Sensory level: T4 Additional Notes Clear free flow CSF on first attempt.  No paresthesia.  Patient tolerated procedure well with no apparent complications.  Jasmine DecemberA. Cassidy, MD

## 2013-06-09 NOTE — Anesthesia Postprocedure Evaluation (Signed)
  Anesthesia Post-op Note  Anesthesia Post Note  Patient: Katelyn Hinton  Procedure(s) Performed: Procedure(s) (LRB): CESAREAN SECTION (N/A)  Anesthesia type: Spinal  Patient location: PACU  Post pain: Pain level controlled  Post assessment: Post-op Vital signs reviewed  Last Vitals:  Filed Vitals:   06/09/13 1615  BP: 134/85  Pulse:   Temp:   Resp: 20    Post vital signs: Reviewed  Level of consciousness: awake  Complications: No apparent anesthesia complications

## 2013-06-09 NOTE — Progress Notes (Signed)
.   Katelyn Hinton is a 28 y.o. G1P0 at 3831w1d admitted for Severe Preeclampsia  Subjective: No complaints.  Pt anticipating delivery today and is a little nervous.  Objective: BP 126/67  Pulse 80  Temp(Src) 98.9 F (37.2 C) (Oral)  Resp 18  Ht 5\' 1"  (1.549 m)  Wt 79.833 kg (176 lb)  BMI 33.27 kg/m2  SpO2 100% I/O last 3 completed shifts: In: 5879.4 [P.O.:2210; I.V.:3369.4; IV Piggyback:300] Out: 3950 [Urine:3950]    Physical Exam:  Gen: alert Chest/Lungs: cta bilaterally  Heart/Pulse: RRR  Abdomen: soft, gravid, nontender Uterine fundus: soft, nontender Skin & Color: warm and dry  EXT: negative Homan's b/l  FHT:  Cat 1 UC:   none SVE:    deferred  Bedside u/s - breech  Labs: Lab Results  Component Value Date   WBC 7.7 06/09/2013   HGB 10.4* 06/09/2013   HCT 30.2* 06/09/2013   MCV 88.0 06/09/2013   PLT 190 06/09/2013    Assessment and Plan: P0 at 32 1/7 wks scheduled for c/s secondary to severe preeclampsia per MFM recommendations to be done following course of steroids and Breech presentation.  R/B/A reviewed with the patient, patient verbalized understanding and consent signed and witnessed.  Fetal status is reassuring.  Pt previously spoke with Dr. Gerarda Guntherevanzo and understands baby will go to NICU.  Glucomander for glucose control and continuing mg sulfate after delivery.   Katelyn Hinton 06/09/2013, 1:04 PM

## 2013-06-09 NOTE — Anesthesia Preprocedure Evaluation (Addendum)
Anesthesia Evaluation  Patient identified by MRN, date of birth, ID band Patient awake    Reviewed: Allergy & Precautions, H&P , NPO status , Patient's Chart, lab work & pertinent test results, reviewed documented beta blocker date and time   Airway Mallampati: III TM Distance: >3 FB Neck ROM: full    Dental  (+) Teeth Intact   Pulmonary neg pulmonary ROS,  breath sounds clear to auscultation  Pulmonary exam normal       Cardiovascular hypertension (preeclampsia), Rhythm:regular Rate:Normal     Neuro/Psych negative neurological ROS  negative psych ROS   GI/Hepatic negative GI ROS, Neg liver ROS,   Endo/Other  diabetes, Gestational, Insulin DependentBMI 33.3  Renal/GU negative Renal ROS     Musculoskeletal   Abdominal Normal abdominal exam  (+)   Peds  Hematology  (+) anemia ,   Anesthesia Other Findings 1145 - attempted to interview pt, patient not available at this time- A. Rodman Pickleassidy, MD  Reproductive/Obstetrics (+) Pregnancy (32 weeks, preeclampsia, GDM, breech --> C/S)                          Anesthesia Physical Anesthesia Plan  ASA: III  Anesthesia Plan: Spinal   Post-op Pain Management:    Induction:   Airway Management Planned:   Additional Equipment:   Intra-op Plan:   Post-operative Plan:   Informed Consent: I have reviewed the patients History and Physical, chart, labs and discussed the procedure including the risks, benefits and alternatives for the proposed anesthesia with the patient or authorized representative who has indicated his/her understanding and acceptance.     Plan Discussed with: Surgeon and CRNA  Anesthesia Plan Comments:         Anesthesia Quick Evaluation

## 2013-06-10 DIAGNOSIS — Z98891 History of uterine scar from previous surgery: Secondary | ICD-10-CM

## 2013-06-10 LAB — CBC
HCT: 21.7 % — ABNORMAL LOW (ref 36.0–46.0)
HEMOGLOBIN: 7.2 g/dL — AB (ref 12.0–15.0)
MCH: 29.3 pg (ref 26.0–34.0)
MCHC: 33.2 g/dL (ref 30.0–36.0)
MCV: 88.2 fL (ref 78.0–100.0)
Platelets: 189 10*3/uL (ref 150–400)
RBC: 2.46 MIL/uL — ABNORMAL LOW (ref 3.87–5.11)
RDW: 12.9 % (ref 11.5–15.5)
WBC: 7 10*3/uL (ref 4.0–10.5)

## 2013-06-10 LAB — COMPREHENSIVE METABOLIC PANEL
ALK PHOS: 69 U/L (ref 39–117)
ALT: 17 U/L (ref 0–35)
AST: 22 U/L (ref 0–37)
Albumin: 2.2 g/dL — ABNORMAL LOW (ref 3.5–5.2)
BUN: 12 mg/dL (ref 6–23)
CO2: 24 mEq/L (ref 19–32)
Calcium: 6.7 mg/dL — ABNORMAL LOW (ref 8.4–10.5)
Chloride: 103 mEq/L (ref 96–112)
Creatinine, Ser: 0.51 mg/dL (ref 0.50–1.10)
GFR calc Af Amer: >90 mL/min (ref >90)
GFR calc non Af Amer: >90 mL/min (ref >90)
Glucose, Bld: 96 mg/dL (ref 70–99)
POTASSIUM: 4 meq/L (ref 3.7–5.3)
SODIUM: 136 meq/L — AB (ref 137–147)
Total Bilirubin: <0.2 mg/dL — ABNORMAL LOW (ref 0.3–1.2)
Total Protein: 4.8 g/dL — ABNORMAL LOW (ref 6.0–8.3)

## 2013-06-10 LAB — MAGNESIUM: Magnesium: 4.5 mg/dL — ABNORMAL HIGH (ref 1.5–2.5)

## 2013-06-10 LAB — GLUCOSE, CAPILLARY: Glucose-Capillary: 88 mg/dL (ref 70–99)

## 2013-06-10 MED ORDER — FERROUS SULFATE 325 (65 FE) MG PO TABS
325.0000 mg | ORAL_TABLET | Freq: Two times a day (BID) | ORAL | Status: DC
  Administered 2013-06-10 – 2013-06-12 (×5): 325 mg via ORAL
  Filled 2013-06-10 (×5): qty 1

## 2013-06-10 MED ORDER — SODIUM CHLORIDE 0.9 % IJ SOLN
3.0000 mL | Freq: Two times a day (BID) | INTRAMUSCULAR | Status: DC
  Administered 2013-06-10: 3 mL via INTRAVENOUS

## 2013-06-10 MED ORDER — SODIUM CHLORIDE 0.9 % IJ SOLN: 3.0000 mL | INTRAMUSCULAR | Status: DC | PRN

## 2013-06-10 NOTE — Addendum Note (Signed)
Addendum created 06/10/13 1028 by Lincoln BrighamAngela Draughon Analeigh Aries, CRNA   Modules edited: Notes Section   Notes Section:  File: [id number]

## 2013-06-10 NOTE — Progress Notes (Addendum)
Subjective: Postpartum Day 1: Cesarean Delivery due to severe pre-eclampsia, breech Patient has ambulated to BR, visited baby in NICU during the night, reports no syncope or dizziness.  Denies HA, visual sx, epigastric pain, SOB, or chest pain. Feeding:  Plans breastfeeding Contraceptive plan:  Undecided Foley still in place, draining clear fluid. Ate last night at 10pm ([place]) Baby stable in NICU  Objective: Vital signs in last 24 hours: Temp:  [97.5 F (36.4 C)-99.3 F (37.4 C)] 99.2 F (37.3 C) (04/18 0531) Pulse Rate:  [74-114] 88 (04/18 0700) Resp:  [13-26] 18 (04/18 0600) BP: (104-145)/(61-99) 112/73 mmHg (04/18 0700) SpO2:  [93 %-100 %] 100 % (04/18 0700) Weight:  [173 lb 3.2 oz (78.563 kg)-176 lb (79.833 kg)] 176 lb (79.833 kg) (04/18 0547)  Filed Vitals:   06/10/13 0531 06/10/13 0547 06/10/13 0600 06/10/13 0700  BP:   112/80 112/73  Pulse: 96  93 88  Temp: 99.2 F (37.3 C)     TempSrc: Oral     Resp:   18   Height:      Weight:  176 lb (79.833 kg)    SpO2: 99%  100% 100%   BP range 104-117/61-80 during night  CBG (last 3)   Recent Labs  06/09/13 2046 06/09/13 2215 06/10/13 0644  GLUCAP 93 83 88    Physical Exam:  General: alert Lochia: appropriate Uterine Fundus: firm Incision: Honeycomb dressing CDI DVT Evaluation: No evidence of DVT seen on physical exam. Negative Homan's sign.  DTR 1+, no clonus, +1 edema.  SCDs in place. JP drain:   57 cc overnight, serosanguinous  Magnesium at 2 gm/hour since delivery at 1:40pm.  I/O: +998.9 last 24 hours Output last shift:  625 cc   Recent Labs  06/09/13 1100 06/09/13 2023 06/10/13 0525  HGB 10.4* 8.3* 7.2*  HCT 30.2* 24.6* 21.7*  WBC 7.7 11.4* 7.0    Results for orders placed during the hospital encounter of 06/07/13 (from the past 24 hour(s))  GLUCOSE, CAPILLARY     Status: Abnormal   Collection Time    06/09/13  8:02 AM      Result Value Ref Range   Glucose-Capillary 118 (*) 70 - 99  mg/dL  GLUCOSE, CAPILLARY     Status: Abnormal   Collection Time    06/09/13  9:02 AM      Result Value Ref Range   Glucose-Capillary 123 (*) 70 - 99 mg/dL  GLUCOSE, CAPILLARY     Status: Abnormal   Collection Time    06/09/13 10:04 AM      Result Value Ref Range   Glucose-Capillary 101 (*) 70 - 99 mg/dL  CBC     Status: Abnormal   Collection Time    06/09/13 11:00 AM      Result Value Ref Range   WBC 7.7  4.0 - 10.5 K/uL   RBC 3.43 (*) 3.87 - 5.11 MIL/uL   Hemoglobin 10.4 (*) 12.0 - 15.0 g/dL   HCT 16.130.2 (*) 09.636.0 - 04.546.0 %   MCV 88.0  78.0 - 100.0 fL   MCH 30.3  26.0 - 34.0 pg   MCHC 34.4  30.0 - 36.0 g/dL   RDW 40.912.7  81.111.5 - 91.415.5 %   Platelets 190  150 - 400 K/uL  COMPREHENSIVE METABOLIC PANEL     Status: Abnormal   Collection Time    06/09/13 11:00 AM      Result Value Ref Range   Sodium 139  137 - 147 mEq/L  Potassium 4.0  3.7 - 5.3 mEq/L   Chloride 104  96 - 112 mEq/L   CO2 22  19 - 32 mEq/L   Glucose, Bld 103 (*) 70 - 99 mg/dL   BUN 10  6 - 23 mg/dL   Creatinine, Ser 6.04 (*) 0.50 - 1.10 mg/dL   Calcium 7.3 (*) 8.4 - 10.5 mg/dL   Total Protein 6.1  6.0 - 8.3 g/dL   Albumin 2.7 (*) 3.5 - 5.2 g/dL   AST 19  0 - 37 U/L   ALT 17  0 - 35 U/L   Alkaline Phosphatase 86  39 - 117 U/L   Total Bilirubin <0.2 (*) 0.3 - 1.2 mg/dL   GFR calc non Af Amer >90  >90 mL/min   GFR calc Af Amer >90  >90 mL/min  URIC ACID     Status: None   Collection Time    06/09/13 11:00 AM      Result Value Ref Range   Uric Acid, Serum 5.6  2.4 - 7.0 mg/dL  RPR     Status: None   Collection Time    06/09/13 11:00 AM      Result Value Ref Range   RPR NON REAC  NON REAC  GLUCOSE, CAPILLARY     Status: None   Collection Time    06/09/13 11:10 AM      Result Value Ref Range   Glucose-Capillary 96  70 - 99 mg/dL  GLUCOSE, CAPILLARY     Status: None   Collection Time    06/09/13 12:11 PM      Result Value Ref Range   Glucose-Capillary 89  70 - 99 mg/dL  GLUCOSE, CAPILLARY     Status: None    Collection Time    06/09/13  1:19 PM      Result Value Ref Range   Glucose-Capillary 92  70 - 99 mg/dL  GLUCOSE, CAPILLARY     Status: Abnormal   Collection Time    06/09/13  2:49 PM      Result Value Ref Range   Glucose-Capillary 102 (*) 70 - 99 mg/dL  GLUCOSE, CAPILLARY     Status: None   Collection Time    06/09/13  4:06 PM      Result Value Ref Range   Glucose-Capillary 91  70 - 99 mg/dL  GLUCOSE, CAPILLARY     Status: None   Collection Time    06/09/13  5:19 PM      Result Value Ref Range   Glucose-Capillary 81  70 - 99 mg/dL  CBC     Status: Abnormal   Collection Time    06/09/13  8:23 PM      Result Value Ref Range   WBC 11.4 (*) 4.0 - 10.5 K/uL   RBC 2.80 (*) 3.87 - 5.11 MIL/uL   Hemoglobin 8.3 (*) 12.0 - 15.0 g/dL   HCT 54.0 (*) 98.1 - 19.1 %   MCV 87.9  78.0 - 100.0 fL   MCH 29.6  26.0 - 34.0 pg   MCHC 33.7  30.0 - 36.0 g/dL   RDW 47.8  29.5 - 62.1 %   Platelets 204  150 - 400 K/uL  COMPREHENSIVE METABOLIC PANEL     Status: Abnormal   Collection Time    06/09/13  8:23 PM      Result Value Ref Range   Sodium 138  137 - 147 mEq/L   Potassium 4.1  3.7 - 5.3 mEq/L  Chloride 103  96 - 112 mEq/L   CO2 23  19 - 32 mEq/L   Glucose, Bld 97  70 - 99 mg/dL   BUN 10  6 - 23 mg/dL   Creatinine, Ser 1.61 (*) 0.50 - 1.10 mg/dL   Calcium 6.8 (*) 8.4 - 10.5 mg/dL   Total Protein 4.9 (*) 6.0 - 8.3 g/dL   Albumin 2.4 (*) 3.5 - 5.2 g/dL   AST 21  0 - 37 U/L   ALT 18  0 - 35 U/L   Alkaline Phosphatase 73  39 - 117 U/L   Total Bilirubin <0.2 (*) 0.3 - 1.2 mg/dL   GFR calc non Af Amer >90  >90 mL/min   GFR calc Af Amer >90  >90 mL/min  URIC ACID     Status: None   Collection Time    06/09/13  8:23 PM      Result Value Ref Range   Uric Acid, Serum 5.8  2.4 - 7.0 mg/dL  LACTATE DEHYDROGENASE     Status: Abnormal   Collection Time    06/09/13  8:23 PM      Result Value Ref Range   LDH 258 (*) 94 - 250 U/L  MAGNESIUM     Status: Abnormal   Collection Time     06/09/13  8:23 PM      Result Value Ref Range   Magnesium 4.7 (*) 1.5 - 2.5 mg/dL  GLUCOSE, CAPILLARY     Status: None   Collection Time    06/09/13  8:46 PM      Result Value Ref Range   Glucose-Capillary 93  70 - 99 mg/dL   Comment 1 Documented in Chart    GLUCOSE, CAPILLARY     Status: None   Collection Time    06/09/13 10:15 PM      Result Value Ref Range   Glucose-Capillary 83  70 - 99 mg/dL   Comment 1 Documented in Chart    CBC     Status: Abnormal   Collection Time    06/10/13  5:25 AM      Result Value Ref Range   WBC 7.0  4.0 - 10.5 K/uL   RBC 2.46 (*) 3.87 - 5.11 MIL/uL   Hemoglobin 7.2 (*) 12.0 - 15.0 g/dL   HCT 09.6 (*) 04.5 - 40.9 %   MCV 88.2  78.0 - 100.0 fL   MCH 29.3  26.0 - 34.0 pg   MCHC 33.2  30.0 - 36.0 g/dL   RDW 81.1  91.4 - 78.2 %   Platelets 189  150 - 400 K/uL  COMPREHENSIVE METABOLIC PANEL     Status: Abnormal   Collection Time    06/10/13  5:25 AM      Result Value Ref Range   Sodium 136 (*) 137 - 147 mEq/L   Potassium 4.0  3.7 - 5.3 mEq/L   Chloride 103  96 - 112 mEq/L   CO2 24  19 - 32 mEq/L   Glucose, Bld 96  70 - 99 mg/dL   BUN 12  6 - 23 mg/dL   Creatinine, Ser 9.56  0.50 - 1.10 mg/dL   Calcium 6.7 (*) 8.4 - 10.5 mg/dL   Total Protein 4.8 (*) 6.0 - 8.3 g/dL   Albumin 2.2 (*) 3.5 - 5.2 g/dL   AST 22  0 - 37 U/L   ALT 17  0 - 35 U/L   Alkaline Phosphatase 69  39 - 117  U/L   Total Bilirubin <0.2 (*) 0.3 - 1.2 mg/dL   GFR calc non Af Amer >90  >90 mL/min   GFR calc Af Amer >90  >90 mL/min  MAGNESIUM     Status: Abnormal   Collection Time    06/10/13  5:25 AM      Result Value Ref Range   Magnesium 4.5 (*) 1.5 - 2.5 mg/dL  GLUCOSE, CAPILLARY     Status: None   Collection Time    06/10/13  6:44 AM      Result Value Ref Range   Glucose-Capillary 88  70 - 99 mg/dL     Assessment/Plan: Status post Cesarean section day 1 Severe pre-eclampsia--stable Gestational diabetes--stable CBGs Anemia, with hemodynamic  stability  Plan: Doing well postoperatively. Continue magnesium sulfate until 1:40pm, then observe x 4 hours. [facility]. FBS tomorrow (d/c QID CBGs) CBC tomorrow Orthostatic VS Fe daily Plan d/c foley when ambulating this am  Support to patient for NICU baby.   Nigel BridgemanVicki Latham 06/10/2013, 7:43 AM  Agree with above.  Patient is doing well.  Currently off Mg and BPs are ok.  Will cont to monitor.  UOP is good.  CBGs have been good.  Will check fasting in am.  Tolerating po without difficulty.  [facility].

## 2013-06-10 NOTE — Anesthesia Postprocedure Evaluation (Signed)
  Anesthesia Post-op Note  Patient: Katelyn Hinton  Procedure(s) Performed: Procedure(s): CESAREAN SECTION (N/A)  Patient Location: A-ICU  Anesthesia Type:Spinal  Level of Consciousness: awake, alert  and oriented  Airway and Oxygen Therapy: Patient Spontanous Breathing  Post-op Pain: mild  Post-op Assessment: Patient's Cardiovascular Status Stable, Respiratory Function Stable, Patent Airway, No signs of Nausea or vomiting, Adequate PO intake, Pain level controlled, No headache, No backache, No residual numbness and No residual motor weakness  Post-op Vital Signs: stable  Last Vitals:  Filed Vitals:   06/10/13 1010  BP: 139/96  Pulse: 93  Temp:   Resp: 16    Complications: No apparent anesthesia complications

## 2013-06-10 NOTE — Progress Notes (Addendum)
Pt ambulated to Rm #[id number] for transfer. Report given to night shift RN Rosario AdieMaggie Latham.

## 2013-06-11 LAB — CBC
HCT: 20.8 % — ABNORMAL LOW (ref 36.0–46.0)
Hemoglobin: 6.9 g/dL — CL (ref 12.0–15.0)
MCH: 29.2 pg (ref 26.0–34.0)
MCHC: 33.2 g/dL (ref 30.0–36.0)
MCV: 88.1 fL (ref 78.0–100.0)
PLATELETS: 195 10*3/uL (ref 150–400)
RBC: 2.36 MIL/uL — ABNORMAL LOW (ref 3.87–5.11)
RDW: 12.9 % (ref 11.5–15.5)
WBC: 6.4 10*3/uL (ref 4.0–10.5)

## 2013-06-11 LAB — GLUCOSE, CAPILLARY: GLUCOSE-CAPILLARY: 82 mg/dL (ref 70–99)

## 2013-06-11 NOTE — Progress Notes (Addendum)
Subjective: Postpartum Day 2: Cesarean Delivery at 32 1/7 weeks due to severe pre-eclampsia, breech presentation. Patient up ad lib, reports no syncope or dizziness.  No chest pain or SOB.  Has ambulated in hall and to NICU without difficulty. Feeding:  Pumping Contraceptive plan:  Undecided Baby stable in NICU, on room air. Still NPO.  Objective: Vital signs in last 24 hours: Temp:  [98.6 F (37 C)-99.6 F (37.6 C)] 98.7 F (37.1 C) (04/19 0519) Pulse Rate:  [93-115] 115 (04/19 0519) Resp:  [16-18] 16 (04/19 0519) BP: (113-150)/(67-96) 135/74 mmHg (04/19 0519) SpO2:  [98 %-100 %] 100 % (04/19 0519) Weight:  [171 lb 3 oz (77.65 kg)] 171 lb 3 oz (77.65 kg) (04/19 0540)  Filed Vitals:   06/10/13 2135 06/11/13 0214 06/11/13 0519 06/11/13 0540  BP: 146/78 137/67 135/74   Pulse: 106 115 115   Temp: 98.6 F (37 C) 99.6 F (37.6 C) 98.7 F (37.1 C)   TempSrc: Oral Oral Oral   Resp: 18 16 16    Height:      Weight:    171 lb 3 oz (77.65 kg)  SpO2: 100% 100% 100%    Orthostatics stable yesterday--baseline pulse 96-99 Pulse 106-115 through night.    Physical Exam:  General: alert Lochia: appropriate Uterine Fundus: firm Abdomen:  + bowel sounds Incision: Honeycomb dressing CDI DVT Evaluation: No evidence of DVT seen on physical exam. Negative Homan's sign. JP drain:   40 cc last 24 hours, serosanguinous drainage  I/O balance:  --1278.7 last 24 hours   Recent Labs  06/09/13 2023 06/10/13 0525 06/11/13 0518  HGB 8.3* 7.2* 6.9*  HCT 24.6* 21.7* 20.8*  WBC 11.4* 7.0 6.4   CBG (last 3)   Recent Labs  06/09/13 2215 06/10/13 0644 06/11/13 0516  GLUCAP 83 88 82      Assessment/Plan: Status post Cesarean section day 2--severe pre-eclampsia, breech Anemia--stable clinically Tachycardia, due to anemia Hx severe pre-eclampsia--Normotensive, no meds GDM--normal CBGs  Plan: Doing well postoperatively.  Reviewed R&B of transfusion--patient declines at  present. Continue iron supplementation CBC tomorrow Doing well postoperatively.  Continue current care. Plan for discharge tomorrow Reviewed plan for removal of honeycomb dressing after d/c--will need removal on day 3 after d/c (Thursday, 4/23).  Advised patient I could see her on Thursday evening after 7pm when she comes to see baby in NICU and remove dressing, if she remains anxious about doing it herself.  Will have her call after hours number on Thursday evening to arrange [facility], if she desires.    Nigel BridgemanVicki Elisha Cooksey 06/11/2013, 7:33 AM

## 2013-06-11 NOTE — Lactation Note (Signed)
This note was copied from the chart of Boy Pearson Grippesha Colavito. Lactation Consultation Note: Initial visit with this mom of a NICU baby. RN has set up pump for her and she reports that she has pumped 2 times so far today and pumped 2 times yesterday. Encouraged to pump 8 times/day to promote a good milk supply. Reviewed good massage and hand expressing before and after pumping. Reports that she has only obtained a few drops of Colostrum Encouragement given.Reviewed cleaning of pump pieces. NICU booklet reviewed with mom including pumping log. Plans to call WIC in the morning about a pump for home.No questions at present. To call prn.    Patient Name: Boy Pearson Grippesha Heacock ZOXWR'UToday's Date: 06/11/2013 Reason for consult: Initial assessment   Maternal Data Formula Feeding for Exclusion: No Infant to breast within first hour of birth: No Breastfeeding delayed due to:: Infant status Has patient been taught Hand Expression?: Yes Does the patient have breastfeeding experience prior to this delivery?: No  Feeding    LATCH Score/Interventions                      Lactation Tools Discussed/Used WIC Program: Yes Pump Review: Setup, frequency, and cleaning;Milk Storage Initiated by:: RN   Consult Status Consult Status: Follow-up Date: 06/12/13 Follow-up type: In-patient    Pamelia HoitDonna D Cherl Gorney 06/11/2013, 1:03 PM

## 2013-06-11 NOTE — Progress Notes (Signed)
Clinical Social Work Department PSYCHOSOCIAL ASSESSMENT - MATERNAL/CHILD 06/11/2013  Patient:  Katelyn Hinton, Katelyn Hinton  Account Number:  [id number]  Admit Date:  06/07/2013  Ardine Eng Name:   [name]    Clinical Social Worker:  Jahmai Finelli, LCSW   Date/Time:  06/11/2013 10:30 AM  Date Referred:  06/11/2013   Referral source  NICU     Referred reason  NICU   Other referral source:    I:  FAMILY / [place] legal guardian:  PARENT  Guardian - Name Guardian - Age Guardian - Address  Hinton,Katelyn [address]  [place], [place] [address]  [name] 30 same as above   Other household support members/support persons Other support:    II  PSYCHOSOCIAL DATA Information Source:    Occupational hygienist Employment:   Spouse is employed   Museum/gallery curator resources:  [employer] [place]:   Other  [place] / Grade:   Maternity Care Coordinator / Child Services Coordination / Early Interventions:  Cultural issues impacting care:    III  STRENGTHS Strengths  Supportive family/friends  Home prepared for Child (including basic supplies)  Compliance with medical plan  Adequate Resources  Understanding of illness   Strength comment:    IV  RISK FACTORS AND CURRENT PROBLEMS Current Problem:       V  SOCIAL WORK ASSESSMENT Met with mother who was pleasant and receptive to social work intervention.  Parents are married.   They are of [place] descent.  They have no other dependents.  Father is employed and mother states that she stopped working Dec. 2014.    Both parents seems to be coping well with newborn NICU admission.  Informed that they have spoken with the medical team.    Mother spoke of the unforeseen events leading to her early delivery.  Provided support as she spoke of the acute change in her health.   Mother reports adequate support.  Informed that friends and family hosted a baby shower for her last Sunday, and she  does not anticipate any problems with getting other essentials not obtained at the baby shower.  Mother denies any hx of substance abuse or mental illness.  No acute social concerns related at this time.  CSW will follow PRN.      VI SOCIAL WORK PLAN Social Work Plan  Psychosocial Support/Ongoing Assessment of Needs

## 2013-06-12 ENCOUNTER — Encounter (HOSPITAL_COMMUNITY): Payer: Self-pay | Admitting: Obstetrics and Gynecology

## 2013-06-12 LAB — CBC
HCT: 22.3 % — ABNORMAL LOW (ref 36.0–46.0)
Hemoglobin: 7.1 g/dL — ABNORMAL LOW (ref 12.0–15.0)
MCH: 28.4 pg (ref 26.0–34.0)
MCHC: 31.8 g/dL (ref 30.0–36.0)
MCV: 89.2 fL (ref 78.0–100.0)
PLATELETS: 219 10*3/uL (ref 150–400)
RBC: 2.5 MIL/uL — ABNORMAL LOW (ref 3.87–5.11)
RDW: 13 % (ref 11.5–15.5)
WBC: 7.5 10*3/uL (ref 4.0–10.5)

## 2013-06-12 MED ORDER — FERROUS SULFATE 325 (65 FE) MG PO TABS
325.0000 mg | ORAL_TABLET | Freq: Three times a day (TID) | ORAL | Status: DC
  Administered 2013-06-12: 325 mg via ORAL
  Filled 2013-06-12: qty 1

## 2013-06-12 NOTE — Discharge Summary (Signed)
Cesarean Section Delivery Discharge Summary  Katelyn Grippesha Tindol  DOB:    01/05/1986 MRN:    [id number] CSN:    [id number]  Date of admission:                  06/07/2013   Date of discharge:                   06/12/2013  Procedures this admission: Betamethasone course, MgSO4, Preterm delivery via Primary LTCS for fetal malpresentation  Date of Delivery: 06/09/2013  Newborn Data:  Live born female  Birth Weight: 3 lb 3.2 oz (1450 g) APGAR: 6, 8  Home with mother. Name: Martina SinnerHamza Circumcision Plan: Undecided  History of Present Illness:  [name] is a 28 y.o. female, G1P0101, who presents at 4150w1d weeks gestation. [facility]    Her pregnancy has been complicated by:  Patient Active Problem List   Diagnosis Date Noted  . Status post primary low transverse cesarean section 06/10/2013  . Preeclampsia 06/07/2013   Preeclampsia.  Hospital course:  The patient was admitted for preeclampsia. She received betamethasone and was placed on magnesium sulfate intrapartum and postpartum.  Her postpartum course was not complicated. She was offered blood transfusion for asymptomatic anemia, but declines at present. She was discharged to home on postpartum day 3 doing well.  JP Drain and honeycomb dressing removed prior to discharge.  Steri strips remain in place.  Wound care instructions given.   Feeding:  breast  Contraception:  no method  Discharge hemoglobin:  Hemoglobin  Date Value Ref Range Status  06/12/2013 7.1* 12.0 - 15.0 g/dL Final     HCT  Date Value Ref Range Status  06/12/2013 22.3* 36.0 - 46.0 % Final    Discharge Physical Exam:   General: alert, cooperative and no distress Lochia: appropriate Uterine Fundus: firm, U/-1 Incision: healing well, no significant drainage, no dehiscence DVT Evaluation: No evidence of DVT seen on physical  exam. Negative Homan's sign.  Intrapartum Procedures: cesarean: low cervical, transverse Postpartum Procedures: MgSO4 Complications-Operative and Postpartum: none  Discharge Diagnoses: Preterm delivery via Primary LTCS  Discharge Information:  Activity:           pelvic rest Diet:                routine Medications: Ibuprofen, Colace, Iron and pt declines perocet at present Condition:      stable Instructions:  Care After Cesarean Delivery  Refer to this sheet in the next few weeks. These instructions provide you with information on caring for yourself after your procedure. Your caregiver may also give you specific instructions. Your treatment has been planned according to current medical practices, but problems sometimes occur. Call your caregiver if you have any problems or questions after you go home. HOME CARE INSTRUCTIONS  Only take over-the-counter or prescription medicines as directed by your caregiver.  Do not drink alcohol, especially if you are breastfeeding or taking medicine to relieve pain.  Do not chew or smoke tobacco.  Continue to use good perineal care. Good perineal care includes:  Wiping your perineum from front to back.  Keeping your perineum clean.  Check your cut (incision) daily for increased redness, drainage, swelling, or separation of skin.  Clean your incision gently with soap and water every day, and then pat it dry. If your caregiver says it is okay, leave the incision uncovered. Use a  bandage (dressing) if the incision is draining fluid or appears irritated. If the adhesive strips across the incision do not fall off within 7 days, carefully peel them off.  Hug a pillow when coughing or sneezing until your incision is healed. This helps to relieve pain.  Do not use tampons or douche until your caregiver says it is okay.  Shower, wash your hair, and take tub baths as directed by your caregiver.  Wear a well-fitting bra that provides breast  support.  Limit wearing support panties or control-top hose.  Drink enough fluids to keep your urine clear or pale yellow.  Eat high-fiber foods such as whole grain cereals and breads, brown rice, beans, and fresh fruits and vegetables every day. These foods may help prevent or relieve constipation.  Resume activities such as climbing stairs, driving, lifting, exercising, or traveling as directed by your caregiver.  Talk to your caregiver about resuming sexual activities. This is dependent upon your risk of infection, your rate of healing, and your comfort and desire to resume sexual activity.  Try to have someone help you with your household activities and your newborn for at least a few days after you leave the hospital.  Rest as much as possible. Try to rest or take a nap when your newborn is sleeping.  Increase your activities gradually.  Keep all of your scheduled postpartum appointments. It is very important to keep your scheduled follow-up appointments. At these appointments, your caregiver will be checking to make sure that you are healing physically and emotionally. SEEK MEDICAL CARE IF:   You are passing large clots from your vagina. Save any clots to show your caregiver.  You have a foul smelling discharge from your vagina.  You have trouble urinating.  You are urinating frequently.  You have pain when you urinate.  You have a change in your bowel movements.  You have increasing redness, pain, or swelling near your incision.  You have pus draining from your incision.  Your incision is separating.  You have painful, hard, or reddened breasts.  You have a severe headache.  You have blurred vision or see spots.  You feel sad or depressed.  You have thoughts of hurting yourself or your newborn.  You have questions about your care, the care of your newborn, or medicines.  You are dizzy or lightheaded.  You have a rash.  You have pain, redness, or swelling  at the site of the removed intravenous access (IV) tube.  You have nausea or vomiting.  You stopped breastfeeding and have not had a menstrual period within 12 weeks of stopping.  You are not breastfeeding and have not had a menstrual period within 12 weeks of delivery.  You have a fever. SEEK IMMEDIATE MEDICAL CARE IF:  You have persistent pain.  You have chest pain.  You have shortness of breath.  You faint.  You have leg pain.  You have stomach pain.  Your vaginal bleeding saturates 2 or more sanitary pads in 1 hour. MAKE SURE YOU:   Understand these instructions.  Will watch your condition.  Will get help right away if you are not doing well or get worse. Document Released: 11/01/2001 Document Revised: 11/04/2011 Document Reviewed: 10/07/2011 [facility] Patient Information 2014 [place].   Postpartum Depression and Baby Blues  The postpartum period begins right after the birth of a baby. During this time, there is often a great amount of joy and excitement. It is also a time of considerable  changes in the life of the parent(s). Regardless of how many times a mother gives birth, each child brings new challenges and dynamics to the family. It is not unusual to have feelings of excitement accompanied by confusing shifts in moods, emotions, and thoughts. All mothers are at risk of developing postpartum depression or the "baby blues." These mood changes can occur right after giving birth, or they may occur many months after giving birth. The baby blues or postpartum depression can be mild or severe. Additionally, postpartum depression can resolve rather quickly, or it can be a long-term condition. CAUSES Elevated hormones and their rapid decline are thought to be a main cause of postpartum depression and the baby blues. There are a number of hormones that radically change during and after pregnancy. Estrogen and progesterone usually decrease immediately after delivering  your baby. The level of thyroid hormone and various cortisol steroids also rapidly drop. Other factors that play a major role in these changes include major life events and genetics.  RISK FACTORS If you have any of the following risks for the baby blues or postpartum depression, know what symptoms to watch out for during the postpartum period. Risk factors that may increase the likelihood of getting the baby blues or postpartum depression include:  Havinga personal or family history of depression.  Having depression while being pregnant.  Having premenstrual or oral contraceptive-associated mood issues.  Having exceptional life stress.  Having marital conflict.  Lacking a social support network.  Having a baby with special needs.  Having health problems such as diabetes. SYMPTOMS Baby blues symptoms include:  Brief fluctuations in mood, such as going from extreme happiness to sadness.  Decreased concentration.  Difficulty sleeping.  Crying spells, tearfulness.  Irritability.  Anxiety. Postpartum depression symptoms typically begin within the first month after giving birth. These symptoms include:  Difficulty sleeping or excessive sleepiness.  Marked weight loss.  Agitation.  Feelings of worthlessness.  Lack of interest in activity or food. Postpartum psychosis is a very concerning condition and can be dangerous. Fortunately, it is rare. Displaying any of the following symptoms is cause for immediate medical attention. Postpartum psychosis symptoms include:  Hallucinations and delusions.  Bizarre or disorganized behavior.  Confusion or disorientation. DIAGNOSIS  A diagnosis is made by an evaluation of your symptoms. There are no medical or lab tests that lead to a diagnosis, but there are various questionnaires that a caregiver may use to identify those with the baby blues, postpartum depression, or psychosis. Often times, a screening tool called the [place]  Postnatal Depression Scale is used to diagnose depression in the postpartum period.  TREATMENT The baby blues usually goes away on its own in 1 to 2 weeks. Social support is often all that is needed. You should be encouraged to get adequate sleep and rest. Occasionally, you may be given medicines to help you sleep.  Postpartum depression requires treatment as it can last several months or longer if it is not treated. Treatment may include individual or group therapy, medicine, or both to address any social, physiological, and psychological factors that may play a role in the depression. Regular exercise, a healthy diet, rest, and social support may also be strongly recommended.  Postpartum psychosis is more serious and needs treatment right away. Hospitalization is often needed. HOME CARE INSTRUCTIONS  Get as much rest as you can. Nap when the baby sleeps.  Exercise regularly. Some women find yoga and walking to be beneficial.  Eat a balanced and  nourishing diet.  Do little things that you enjoy. Have a cup of tea, take a bubble bath, read your favorite magazine, or listen to your favorite music.  Avoid alcohol.  Ask for help with household chores, cooking, grocery shopping, or running errands as needed. Do not try to do everything.  Talk to people close to you about how you are feeling. Get support from your partner, family members, friends, or other new moms.  Try to stay positive in how you think. Think about the things you are grateful for.  Do not spend a lot of time alone.  Only take medicine as directed by your caregiver.  Keep all your postpartum appointments.  Let your caregiver know if you have any concerns. SEEK MEDICAL CARE IF: You are having a reaction or problems with your medicine. SEEK IMMEDIATE MEDICAL CARE IF:  You have suicidal feelings.  You feel you may harm the baby or someone else. Document Released: 11/14/2003 Document Revised: 05/04/2011 Document  Reviewed: 12/16/2010 [facility] Patient Information 2014 [employer] [place].  Discharge to: home  Follow-up Information   [facility] In 6 weeks. (Please call if you have any questions or concerns prior to you visit.  [facility] nurse will come to see you on or around 4/27 for blood pressure check. )    Specialty:  Obstetrics and Gynecology   Contact information:   [address] [place] [id number] [phone]       Gerrit HeckJessica Dwanna Goshert , CNM, MSN 06/12/2013

## 2013-06-12 NOTE — Lactation Note (Signed)
This note was copied from the chart of Katelyn Hinton Tucholski. Lactation Consultation Note    Follow up consult with this mom of a NICU baby, now 233 days old, and baby is now 2132 4/7 weeks corrected gestation. Mom has not been pumping on any schedule, so I reviewed with her supply and demand, etc, and how important every 3 hours pumping is. I showed mom how to hand express, and she return demonstrated with good technique. I had mom switch to standard setting, and pump 15-30 minutes. She expressed close to 15 mls of transitional milk. I will follow this family in the NICU.  Patient Name: Katelyn Hinton Smolinsky ZOXWR'UToday's Date: 06/12/2013 Reason for consult: Follow-up assessment;NICU baby   Maternal Data    Feeding Feeding Type: Breast Milk Length of feed: 15 min  LATCH Score/Interventions                      Lactation Tools Discussed/Used WIC Program: Yes Pump Review: Setup, frequency, and cleaning;Milk Storage   Consult Status Consult Status: PRN Follow-up type: In-patient (in NICU)    Alfred LevinsChristine Anne Darthula Desa 06/12/2013, 5:29 PM

## 2013-06-12 NOTE — Discharge Instructions (Signed)
Postpartum Care After Cesarean Delivery °After you deliver your newborn (postpartum period), the usual stay in the hospital is 24 72 hours. If there were problems with your labor or delivery, or if you have other medical problems, you might be in the hospital longer.  °While you are in the hospital, you will receive help and instructions on how to care for yourself and your newborn during the postpartum period.  °While you are in the hospital: °· It is normal for you to have pain or discomfort from the incision in your abdomen. Be sure to tell your nurses when you are having pain, where the pain is located, and what makes the pain worse. °· If you are breastfeeding, you may feel uncomfortable contractions of your uterus for a couple of weeks. This is normal. The contractions help your uterus get back to normal size. °· It is normal to have some bleeding after delivery. °· For the first 1 3 days after delivery, the flow is red and the amount may be similar to a period. °· It is common for the flow to start and stop. °· In the first few days, you may pass some small clots. Let your nurses know if you begin to pass large clots or your flow increases. °· Do not  flush blood clots down the toilet before having the nurse look at them. °· During the next 3 10 days after delivery, your flow should become more watery and pink or brown-tinged in color. °· Ten to fourteen days after delivery, your flow should be a small amount of yellowish-white discharge. °· The amount of your flow will decrease over the first few weeks after delivery. Your flow may stop in 6 8 weeks. Most women have had their flow stop by 12 weeks after delivery. °· You should change your sanitary pads frequently. °· Wash your hands thoroughly with soap and water for at least 20 seconds after changing pads, using the toilet, or before holding or feeding your newborn. °· Your intravenous (IV) tubing will be removed when you are drinking enough fluids. °· The  urine drainage tube (urinary catheter) that was inserted before delivery may be removed within 6 8 hours after delivery or when feeling returns to your legs. You should feel like you need to empty your bladder within the first 6 8 hours after the catheter has been removed. °· In case you become weak, lightheaded, or faint, call your nurse before you get out of bed for the first time and before you take a shower for the first time. °· Within the first few days after delivery, your breasts may begin to feel tender and full. This is called engorgement. Breast tenderness usually goes away within 48 72 hours after engorgement occurs. You may also notice milk leaking from your breasts. If you are not breastfeeding, do not stimulate your breasts. Breast stimulation can make your breasts produce more milk. °· Spending as much time as possible with your newborn is very important. During this time, you and your newborn can feel close and get to know each other. Having your newborn stay in your room (rooming in) will help to strengthen the bond with your newborn. It will give you time to get to know your newborn and become comfortable caring for your newborn. °· Your hormones change after delivery. Sometimes the hormone changes can temporarily cause you to feel sad or tearful. These feelings should not last more than a few days. If these feelings last longer   than that, you should talk to your caregiver.  If desired, talk to your caregiver about methods of family planning or contraception.  Talk to your caregiver about immunizations. Your caregiver may want you to have the following immunizations before leaving the hospital:  Tetanus, diphtheria, and pertussis (Tdap) or tetanus and diphtheria (Td) immunization. It is very important that you and your family (including grandparents) or others caring for your newborn are up-to-date with the Tdap or Td immunizations. The Tdap or Td immunization can help protect your newborn  from getting ill.  Rubella immunization.  Varicella (chickenpox) immunization.  Influenza immunization. You should receive this annual immunization if you did not receive the immunization during your pregnancy. Document Released: 11/04/2011 Document Reviewed: 11/04/2011 [facility] Patient Information 2014 [place], [place]. Iron-Rich Diet An iron-rich diet contains foods that are good sources of iron. Iron is an important mineral that helps your body produce hemoglobin. Hemoglobin is a protein in red blood cells that carries oxygen to the body's tissues. Sometimes, the iron level in your blood can be low. This may be caused by:  A lack of iron in your diet.  Blood loss.  Times of growth, such as during pregnancy or during a child's growth and development. Low levels of iron can cause a decrease in the number of red blood cells. This can result in iron deficiency anemia. Iron deficiency anemia symptoms include:  Tiredness.  Weakness.  Irritability.  Increased chance of infection. Here are some recommendations for daily iron intake:  Males older than 30 years of age need 8 mg of iron per day.  Women ages 4 to 39 need 18 mg of iron per day.  Pregnant women need 27 mg of iron per day, and women who are over 8 years of age and breastfeeding need 9 mg of iron per day.  Women over the age of 67 need 8 mg of iron per day. SOURCES OF IRON There are 2 types of iron that are found in food: heme iron and nonheme iron. Heme iron is absorbed by the body better than nonheme iron. Heme iron is found in meat, poultry, and fish. Nonheme iron is found in grains, beans, and vegetables. Heme Iron Sources Food / Iron (mg)  Chicken liver, 3 oz (85 g)/ 10 mg  Beef liver, 3 oz (85 g)/ 5.5 mg  Oysters, 3 oz (85 g)/ 8 mg  Beef, 3 oz (85 g)/ 2 to 3 mg  Shrimp, 3 oz (85 g)/ 2.8 mg  [place], 3 oz (85 g)/ 2 mg  Chicken, 3 oz (85 g) / 1 mg  Fish (tuna, halibut), 3 oz (85 g)/ 1 mg  Pork, 3 oz (85  g)/ 0.9 mg Nonheme Iron Sources Food / Iron (mg)  Ready-to-eat breakfast cereal, iron-fortified / 3.9 to 7 mg  Tofu,  cup / 3.4 mg  Kidney beans,  cup / 2.6 mg  Baked potato with skin / 2.7 mg  Asparagus,  cup / 2.2 mg  Avocado / 2 mg  Dried peaches,  cup / 1.6 mg  Raisins,  cup / 1.5 mg  Soy milk, 1 cup / 1.5 mg  Whole-wheat bread, 1 slice / 1.2 mg  Spinach, 1 cup / 0.8 mg  Broccoli,  cup / 0.6 mg IRON ABSORPTION Certain foods can decrease the body's absorption of iron. Try to avoid these foods and beverages while eating meals with iron-containing foods:  Coffee.  Tea.  Fiber.  Soy. Foods containing vitamin C can help increase the  amount of iron your body absorbs from iron sources, especially from nonheme sources. Eat foods with vitamin C along with iron-containing foods to increase your iron absorption. Foods that are high in vitamin C include many fruits and vegetables. Some good sources are:  Fresh orange juice.  Oranges.  Strawberries.  Mangoes.  Grapefruit.  Red bell peppers.  Green bell peppers.  Broccoli.  Potatoes with skin.  Tomato juice. Document Released: 09/23/2004 Document Revised: 05/04/2011 Document Reviewed: 07/31/2010 [facility] Patient Information 2014 [place], [place]. Postpartum Depression and Baby Blues The postpartum period begins right after the birth of a baby. During this time, there is often a great amount of joy and excitement. It is also a time of considerable changes in the life of the parent(s). Regardless of how many times a mother gives birth, each child brings new challenges and dynamics to the family. It is not unusual to have feelings of excitement accompanied by confusing shifts in moods, emotions, and thoughts. All mothers are at risk of developing postpartum depression or the "baby blues." These mood changes can occur right after giving birth, or they may occur many months after giving birth. The baby blues or  postpartum depression can be mild or severe. Additionally, postpartum depression can resolve rather quickly, or it can be a long-term condition. CAUSES Elevated hormones and their rapid decline are thought to be a main cause of postpartum depression and the baby blues. There are a number of hormones that radically change during and after pregnancy. Estrogen and progesterone usually decrease immediately after delivering your baby. The level of thyroid hormone and various cortisol steroids also rapidly drop. Other factors that play a major role in these changes include major life events and genetics.  RISK FACTORS If you have any of the following risks for the baby blues or postpartum depression, know what symptoms to watch out for during the postpartum period. Risk factors that may increase the likelihood of getting the baby blues or postpartum depression include:  Havinga personal or family history of depression.  Having depression while being pregnant.  Having premenstrual or oral contraceptive-associated mood issues.  Having exceptional life stress.  Having marital conflict.  Lacking a social support network.  Having a baby with special needs.  Having health problems such as diabetes. SYMPTOMS Baby blues symptoms include:  Brief fluctuations in mood, such as going from extreme happiness to sadness.  Decreased concentration.  Difficulty sleeping.  Crying spells, tearfulness.  Irritability.  Anxiety. Postpartum depression symptoms typically begin within the first month after giving birth. These symptoms include:  Difficulty sleeping or excessive sleepiness.  Marked weight loss.  Agitation.  Feelings of worthlessness.  Lack of interest in activity or food. Postpartum psychosis is a very concerning condition and can be dangerous. Fortunately, it is rare. Displaying any of the following symptoms is cause for immediate medical attention. Postpartum psychosis symptoms  include:  Hallucinations and delusions.  Bizarre or disorganized behavior.  Confusion or disorientation. DIAGNOSIS  A diagnosis is made by an evaluation of your symptoms. There are no medical or lab tests that lead to a diagnosis, but there are various questionnaires that a caregiver may use to identify those with the baby blues, postpartum depression, or psychosis. Often times, a screening tool called the [place] Postnatal Depression Scale is used to diagnose depression in the postpartum period.  TREATMENT The baby blues usually goes away on its own in 1 to 2 weeks. Social support is often all that is needed. You should be  encouraged to get adequate sleep and rest. Occasionally, you may be given medicines to help you sleep.  Postpartum depression requires treatment as it can last several months or longer if it is not treated. Treatment may include individual or group therapy, medicine, or both to address any social, physiological, and psychological factors that may play a role in the depression. Regular exercise, a healthy diet, rest, and social support may also be strongly recommended.  Postpartum psychosis is more serious and needs treatment right away. Hospitalization is often needed. HOME CARE INSTRUCTIONS  Get as much rest as you can. Nap when the baby sleeps.  Exercise regularly. Some women find yoga and walking to be beneficial.  Eat a balanced and nourishing diet.  Do little things that you enjoy. Have a cup of tea, take a bubble bath, read your favorite magazine, or listen to your favorite music.  Avoid alcohol.  Ask for help with household chores, cooking, grocery shopping, or running errands as needed. Do not try to do everything.  Talk to people close to you about how you are feeling. Get support from your partner, family members, friends, or other new moms.  Try to stay positive in how you think. Think about the things you are grateful for.  Do not spend a lot of time  alone.  Only take medicine as directed by your caregiver.  Keep all your postpartum appointments.  Let your caregiver know if you have any concerns. SEEK MEDICAL CARE IF: You are having a reaction or problems with your medicine. SEEK IMMEDIATE MEDICAL CARE IF:  You have suicidal feelings.  You feel you may harm the baby or someone else. Document Released: 11/14/2003 Document Revised: 05/04/2011 Document Reviewed: 11/21/2012 [facility] Patient Information 2014 [place], [place].

## 2013-06-12 NOTE — Progress Notes (Signed)
Discharge instructions given by previous RN. Pt asked stated she had no questions about discharge information and expressed understanding. Pt was transported in wheelchair by NT and discharged from hospital

## 2013-06-12 NOTE — Progress Notes (Signed)
Ur chart review completed.  

## 2013-06-22 ENCOUNTER — Ambulatory Visit: Payer: Self-pay

## 2013-06-22 NOTE — Lactation Note (Signed)
This note was copied from the chart of Katelyn Hinton Callas. Lactation Consultation Note     Follow up consult with this and baby, now 34 weeks corrected gestation and 6813 days old, weighing 3-13.9. I assisted mom with latching baby to breast for the first time today. Mom had pumped 1 hours pre feeding. The baby was skin to skin in cross cradle positiin, rooting and latching, on and off. I fitted mom with a 20 nipple shield, showed her how to apply the shield, and how this keeps the baby latched and elicits a suck. Hamza did well, awake and alert for about 10 minutes, with intermittent suckles. He was also being fed via ng , and flee asleep after 10 minutes. Mom pleased with how she and baby did. I told her I will have her apply NS next time she nuzzles. Hamza tolerated nuzzling well - VSS, oxygen saturations 99-100. I will follow this mom and baby in the NICU.  Patient Name: Katelyn Hinton Mcqueen NWGNF'AToday's Date: 06/22/2013 Reason for consult: Follow-up assessment;NICU baby;Infant < 6lbs;Late preterm infant   Maternal Data    Feeding Feeding Type: Breast Milk Length of feed: 45 min  LATCH Score/Interventions Latch: Repeated attempts needed to sustain latch, nipple held in mouth throughout feeding, stimulation needed to elicit sucking reflex. (20 nipple shiled used to maintain latch and elicit sucks) Intervention(s): Adjust position;Assist with latch;Breast compression  Audible Swallowing: None  Type of Nipple: Everted at rest and after stimulation  Comfort (Breast/Nipple): Soft / non-tender     Hold (Positioning): Assistance needed to correctly position infant at breast and maintain latch. Intervention(s): Breastfeeding basics reviewed;Support Pillows;Position options;Skin to skin  LATCH Score: 6  Lactation Tools Discussed/Used     Consult Status Consult Status: PRN Follow-up type: In-patient (NICU)    Alfred LevinsChristine Anne Brighten Buzzelli 06/22/2013, 3:47 PM

## 2013-12-25 ENCOUNTER — Encounter (HOSPITAL_COMMUNITY): Payer: Self-pay | Admitting: Obstetrics and Gynecology

## 2017-07-17 ENCOUNTER — Other Ambulatory Visit: Payer: Self-pay

## 2017-07-17 ENCOUNTER — Emergency Department (HOSPITAL_COMMUNITY)
Admission: EM | Admit: 2017-07-17 | Discharge: 2017-07-17 | Disposition: A | Discharge status: Home / Self Care | Payer: No Typology Code available for payment source | Attending: Emergency Medicine | Admitting: Emergency Medicine

## 2017-07-17 ENCOUNTER — Encounter (HOSPITAL_COMMUNITY): Payer: Self-pay | Admitting: Emergency Medicine

## 2017-07-17 ENCOUNTER — Emergency Department (HOSPITAL_COMMUNITY): Payer: No Typology Code available for payment source

## 2017-07-17 DIAGNOSIS — K219 Gastro-esophageal reflux disease without esophagitis: Secondary | ICD-10-CM | POA: Insufficient documentation

## 2017-07-17 DIAGNOSIS — K5900 Constipation, unspecified: Secondary | ICD-10-CM

## 2017-07-17 DIAGNOSIS — Z79899 Other long term (current) drug therapy: Secondary | ICD-10-CM | POA: Insufficient documentation

## 2017-07-17 LAB — COMPREHENSIVE METABOLIC PANEL
ALT: 45 U/L (ref 14–54)
AST: 38 U/L (ref 15–41)
Albumin: 4 g/dL (ref 3.5–5.0)
Alkaline Phosphatase: 61 U/L (ref 38–126)
Anion gap: 9 (ref 5–15)
BILIRUBIN TOTAL: 0.6 mg/dL (ref 0.3–1.2)
BUN: 8 mg/dL (ref 6–20)
CALCIUM: 9 mg/dL (ref 8.9–10.3)
CO2: 25 mmol/L (ref 22–32)
CREATININE: 0.73 mg/dL (ref 0.44–1.00)
Chloride: 106 mmol/L (ref 101–111)
GFR calc Af Amer: >60 mL/min (ref >60)
Glucose, Bld: 101 mg/dL — ABNORMAL HIGH (ref 65–99)
POTASSIUM: 3.5 mmol/L (ref 3.5–5.1)
Sodium: 140 mmol/L (ref 135–145)
TOTAL PROTEIN: 7.1 g/dL (ref 6.5–8.1)

## 2017-07-17 LAB — URINALYSIS, ROUTINE W REFLEX MICROSCOPIC
BILIRUBIN URINE: NEGATIVE
Bacteria, UA: NONE SEEN
GLUCOSE, UA: NEGATIVE mg/dL
KETONES UR: NEGATIVE mg/dL
LEUKOCYTES UA: NEGATIVE
NITRITE: NEGATIVE
PH: 7 (ref 5.0–8.0)
Protein, ur: NEGATIVE mg/dL
Specific Gravity, Urine: 1.004 — ABNORMAL LOW (ref 1.005–1.030)

## 2017-07-17 LAB — I-STAT BETA HCG BLOOD, ED (MC, WL, AP ONLY): I-stat hCG, quantitative: <5 m[IU]/mL (ref <5)

## 2017-07-17 LAB — CBC
HCT: 40.2 % (ref 36.0–46.0)
Hemoglobin: 13.1 g/dL (ref 12.0–15.0)
MCH: 29.2 pg (ref 26.0–34.0)
MCHC: 32.6 g/dL (ref 30.0–36.0)
MCV: 89.7 fL (ref 78.0–100.0)
PLATELETS: 270 10*3/uL (ref 150–400)
RBC: 4.48 MIL/uL (ref 3.87–5.11)
RDW: 12.7 % (ref 11.5–15.5)
WBC: 4.5 10*3/uL (ref 4.0–10.5)

## 2017-07-17 LAB — LIPASE, BLOOD: Lipase: 31 U/L (ref 11–51)

## 2017-07-17 MED ORDER — GI COCKTAIL ~~LOC~~
30.0000 mL | Freq: Once | ORAL | Status: AC
  Administered 2017-07-17: 30 mL via ORAL
  Filled 2017-07-17: qty 30

## 2017-07-17 MED ORDER — OMEPRAZOLE 20 MG PO CPDR: 20.0000 mg | DELAYED_RELEASE_CAPSULE | Freq: Every day | ORAL | 0 refills | Status: AC

## 2017-07-17 NOTE — ED Provider Notes (Signed)
[facility] EMERGENCY DEPARTMENT Provider Note   CSN: [id number] Arrival date & time: 07/17/17  9562     History   Chief Complaint Chief Complaint  Patient presents with  . Abdominal Pain    HPI Katelyn Hinton is a 32 y.o. female.  The history is provided by the patient.  Abdominal Pain   This is a chronic problem. The current episode started more than 1 week ago (approximately 1 year ago). The problem occurs constantly. The problem has not changed since onset.The pain is associated with eating (only in the evening and feels food is sitting too long in the stomach). The pain is located in the epigastric region. The quality of the pain is cramping. The pain is moderate. Associated symptoms include vomiting. Pertinent negatives include anorexia, fever, diarrhea, dysuria, frequency, hematuria, headaches, arthralgias and myalgias. The symptoms are aggravated by eating. Nothing relieves the symptoms. Past workup does not include GI consult or CT scan. Her past medical history does not include PUD or ulcerative colitis.    History reviewed. No pertinent past medical history.  Patient Active Problem List   Diagnosis Date Noted  . Status post primary low transverse cesarean section 06/10/2013  . Preeclampsia 06/07/2013    Past Surgical History:  Procedure Laterality Date  . CESAREAN SECTION N/A 06/09/2013   Procedure: CESAREAN SECTION;  Surgeon: Purcell Nails, MD;  Location: [facility];  Service: Obstetrics;  Laterality: N/A;  . EYE SURGERY       OB History    Gravida  1   Para  1   Term      Preterm  1   AB      Living  1     SAB      TAB      Ectopic      Multiple      Live Births  1            Home Medications    Prior to Admission medications   Medication Sig Start Date End Date Taking? Authorizing Provider  acetaminophen (TYLENOL) 325 MG tablet Take 650 mg by mouth every 6 (six) hours as needed for mild pain.   Yes [provider]    Family History Family History  Problem Relation Age of Onset  . Hypertension Mother   . Arthritis Father   . Diabetes Father   . Hypertension Father     Social History Social History   Tobacco Use  . Smoking status: Never Smoker  . Smokeless tobacco: Never Used  Substance Use Topics  . Alcohol use: No  . Drug use: No     Allergies   Patient has no known allergies.   Review of Systems Review of Systems  Constitutional: Negative for fever.  Respiratory: Negative for shortness of breath.   Cardiovascular: Negative for chest pain, palpitations and leg swelling.  Gastrointestinal: Positive for abdominal pain and vomiting. Negative for anorexia and diarrhea.  Genitourinary: Negative for dysuria, frequency and hematuria.  Musculoskeletal: Negative for arthralgias and myalgias.  Neurological: Negative for headaches.  All other systems reviewed and are negative.    Physical Exam Updated Vital Signs BP 109/74   Pulse (!) 59   Temp 97.9 F (36.6 C) (Oral)   Resp 17   Ht  (1.549 m)   Wt 79.4 kg (175 lb)   LMP 07/12/2017   SpO2 100%   BMI 33.07 kg/m   Physical Exam  Constitutional: She is oriented  to person, place, and time. She appears well-developed and well-nourished. No distress.  HENT:  Head: Normocephalic and atraumatic.  Mouth/Throat: No oropharyngeal exudate.  Eyes: Pupils are equal, round, and reactive to light. Conjunctivae are normal.  Neck: Normal range of motion. Neck supple. No JVD present.  Cardiovascular: Normal rate, regular rhythm, normal heart sounds and intact distal pulses.  Pulmonary/Chest: Effort normal and breath sounds normal. No stridor. She has no wheezes. She has no rales.  Abdominal: Soft. Bowel sounds are normal. She exhibits no mass. There is no tenderness. There is no rebound, no guarding, no tenderness at McBurney's point and negative Murphy's sign. No hernia.  Musculoskeletal: Normal range of motion.    Neurological: She is alert and oriented to person, place, and time.  Skin: Skin is warm and dry. Capillary refill takes less than 2 seconds.  Psychiatric: She has a normal mood and affect.     ED Treatments / Results  Labs (all labs ordered are listed, but only abnormal results are displayed) Results for orders placed or performed during the hospital encounter of 07/17/17  Lipase, blood  Result Value Ref Range   Lipase 31 11 - 51 U/L  Comprehensive metabolic panel  Result Value Ref Range   Sodium 140 135 - 145 mmol/L   Potassium 3.5 3.5 - 5.1 mmol/L   Chloride 106 101 - 111 mmol/L   CO2 25 22 - 32 mmol/L   Glucose, Bld 101 (H) 65 - 99 mg/dL   BUN 8 6 - 20 mg/dL   Creatinine, Ser 4.09 0.44 - 1.00 mg/dL   Calcium 9.0 8.9 - 81.1 mg/dL   Total Protein 7.1 6.5 - 8.1 g/dL   Albumin 4.0 3.5 - 5.0 g/dL   AST 38 15 - 41 U/L   ALT 45 14 - 54 U/L   Alkaline Phosphatase 61 38 - 126 U/L   Total Bilirubin 0.6 0.3 - 1.2 mg/dL   GFR calc non Af Amer >60 >60 mL/min   GFR calc Af Amer >60 >60 mL/min   Anion gap 9 5 - 15  CBC  Result Value Ref Range   WBC 4.5 4.0 - 10.5 K/uL   RBC 4.48 3.87 - 5.11 MIL/uL   Hemoglobin 13.1 12.0 - 15.0 g/dL   HCT 91.4 78.2 - 95.6 %   MCV 89.7 78.0 - 100.0 fL   MCH 29.2 26.0 - 34.0 pg   MCHC 32.6 30.0 - 36.0 g/dL   RDW 21.3 08.6 - 57.8 %   Platelets 270 150 - 400 K/uL  Urinalysis, Routine w reflex microscopic  Result Value Ref Range   Color, Urine STRAW (A) YELLOW   APPearance CLEAR CLEAR   Specific Gravity, Urine 1.004 (L) 1.005 - 1.030   pH 7.0 5.0 - 8.0   Glucose, UA NEGATIVE NEGATIVE mg/dL   Hgb urine dipstick LARGE (A) NEGATIVE   Bilirubin Urine NEGATIVE NEGATIVE   Ketones, ur NEGATIVE NEGATIVE mg/dL   Protein, ur NEGATIVE NEGATIVE mg/dL   Nitrite NEGATIVE NEGATIVE   Leukocytes, UA NEGATIVE NEGATIVE   RBC / HPF 0-5 0 - 5 RBC/hpf   WBC, UA 0-5 0 - 5 WBC/hpf   Bacteria, UA NONE SEEN NONE SEEN   Squamous Epithelial / LPF 0-5 0 - 5  I-Stat  beta hCG blood, ED  Result Value Ref Range   I-stat hCG, quantitative <5.0 <5 mIU/mL   Comment 3           Dg Abd Acute W/chest  Result Date:  07/17/2017 CLINICAL DATA:  Abdominal pain and vomiting for a year. EXAM: DG ABDOMEN ACUTE W/ 1V CHEST COMPARISON:  None. FINDINGS: Shallow inspiration. Normal heart size and pulmonary vascularity. No focal airspace disease or consolidation in the lungs. No blunting of costophrenic angles. No pneumothorax. Mediastinal contours appear intact. Scattered gas and stool in the colon. No small or large bowel distention. No free intra-abdominal air. No abnormal air-fluid levels. No radiopaque stones. Visualized bones appear intact. IMPRESSION: Shallow inspiration. No evidence of active pulmonary disease. Normal nonobstructive bowel gas pattern. Electronically Signed   By: Burman Nieves M.D.   On: 07/17/2017 06:14     Procedures Procedures (including critical care time)  Medications Ordered in ED Medications  gi cocktail (Maalox,Lidocaine,Donnatal) (30 mLs Oral Given 07/17/17 0509)     Final Clinical Impressions(s) / ED Diagnoses  Symptoms have been going on for a year, exam, vitals, labs are benign and reassuring, I suspect gerd.  Will start meds and have patient follow up as an outpatient with her PMD and GI.    Return for weakness, numbness, changes in vision or speech, fevers >100.4 unrelieved by medication, shortness of breath, intractable vomiting, or diarrhea, abdominal pain, Inability to tolerate liquids or food, cough, altered mental status or any concerns. No signs of systemic illness or infection. The patient is nontoxic-appearing on exam and vital signs are within normal limits.   I have reviewed the triage vital signs and the nursing notes. Pertinent labs &imaging results that were available during my care of the patient were reviewed by me and considered in my medical decision making (see chart for details).  After history, exam, and  medical workup I feel the patient has been appropriately medically screened and is safe for discharge home. Pertinent diagnoses were discussed with the patient. Patient was given return precautions.         Revel Stellmach, MD 07/17/17 [phone]

## 2017-07-17 NOTE — ED Triage Notes (Addendum)
Pt c/o epigastric pain with n/v x 8-9 every night x 1 year. Pt also c/o "spinal pain" worse after vomiting.  Pt states she is financially unable to see MD.

## 2017-10-05 ENCOUNTER — Encounter: Payer: Self-pay | Admitting: Gastroenterology

## 2017-11-16 ENCOUNTER — Ambulatory Visit: Payer: No Typology Code available for payment source | Admitting: Gastroenterology

## 2017-11-28 ENCOUNTER — Other Ambulatory Visit: Payer: Self-pay

## 2017-11-28 ENCOUNTER — Emergency Department (HOSPITAL_COMMUNITY)
Admission: EM | Admit: 2017-11-28 | Discharge: 2017-11-28 | Disposition: A | Discharge status: Home / Self Care | Payer: 59 | Attending: Emergency Medicine | Admitting: Emergency Medicine

## 2017-11-28 ENCOUNTER — Encounter (HOSPITAL_COMMUNITY): Payer: Self-pay | Admitting: Emergency Medicine

## 2017-11-28 DIAGNOSIS — Z112 Encounter for screening for other bacterial diseases: Secondary | ICD-10-CM | POA: Diagnosis not present

## 2017-11-28 DIAGNOSIS — Z79899 Other long term (current) drug therapy: Secondary | ICD-10-CM | POA: Diagnosis not present

## 2017-11-28 DIAGNOSIS — R1013 Epigastric pain: Secondary | ICD-10-CM

## 2017-11-28 LAB — URINALYSIS, ROUTINE W REFLEX MICROSCOPIC
Bilirubin Urine: NEGATIVE
GLUCOSE, UA: NEGATIVE mg/dL
Hgb urine dipstick: NEGATIVE
KETONES UR: NEGATIVE mg/dL
LEUKOCYTES UA: NEGATIVE
Nitrite: NEGATIVE
PH: 7 (ref 5.0–8.0)
Protein, ur: NEGATIVE mg/dL
SPECIFIC GRAVITY, URINE: 1.001 — AB (ref 1.005–1.030)

## 2017-11-28 LAB — COMPREHENSIVE METABOLIC PANEL
ALT: 37 U/L (ref 0–44)
AST: 30 U/L (ref 15–41)
Albumin: 4.4 g/dL (ref 3.5–5.0)
Alkaline Phosphatase: 61 U/L (ref 38–126)
Anion gap: 8 (ref 5–15)
BILIRUBIN TOTAL: 0.6 mg/dL (ref 0.3–1.2)
BUN: 11 mg/dL (ref 6–20)
CO2: 27 mmol/L (ref 22–32)
Calcium: 9.3 mg/dL (ref 8.9–10.3)
Chloride: 104 mmol/L (ref 98–111)
Creatinine, Ser: 0.77 mg/dL (ref 0.44–1.00)
Glucose, Bld: 97 mg/dL (ref 70–99)
POTASSIUM: 3.7 mmol/L (ref 3.5–5.1)
Sodium: 139 mmol/L (ref 135–145)
TOTAL PROTEIN: 8 g/dL (ref 6.5–8.1)

## 2017-11-28 LAB — CBC
HEMATOCRIT: 39.3 % (ref 36.0–46.0)
HEMOGLOBIN: 12.8 g/dL (ref 12.0–15.0)
MCH: 29 pg (ref 26.0–34.0)
MCHC: 32.6 g/dL (ref 30.0–36.0)
MCV: 89.1 fL (ref 78.0–100.0)
Platelets: 278 10*3/uL (ref 150–400)
RBC: 4.41 MIL/uL (ref 3.87–5.11)
RDW: 12.6 % (ref 11.5–15.5)
WBC: 6.3 10*3/uL (ref 4.0–10.5)

## 2017-11-28 LAB — LIPASE, BLOOD: Lipase: 35 U/L (ref 11–51)

## 2017-11-28 LAB — I-STAT BETA HCG BLOOD, ED (MC, WL, AP ONLY): I-stat hCG, quantitative: <5 m[IU]/mL (ref <5)

## 2017-11-28 MED ORDER — FAMOTIDINE 20 MG PO TABS: 20.0000 mg | ORAL_TABLET | Freq: Every day | ORAL | 0 refills | Status: DC

## 2017-11-28 NOTE — ED Provider Notes (Signed)
[facility]-EMERGENCY DEPT Provider Note   CSN: [id number] Arrival date & time: 11/28/17  0446     History   Chief Complaint Chief Complaint  Patient presents with  . Abdominal Pain    HPI Katelyn Hinton is a 32 y.o. female.  The history is provided by the patient and medical records. No language interpreter was used.  Abdominal Pain   Associated symptoms include nausea and vomiting.   Katelyn Hinton is a 32 y.o. female who presents to the Emergency Department complaining of daily epigastric pain which is been ongoing for about a year now.  She states that her symptoms just occur at night.  She will define all during the day, but at night, she has excruciating epigastric pain that radiates into her chest.  This is associated with vomiting at least 7-8 times each night.  She was seen in the ER back in May where she was told this can be due to reflux and was told to see a GI doctor.  She has done this.  She saw GI with [facility] who diagnosed her with H. pylori.  She has completed full course of treatment without any improvement.  She is scheduled for both an EGD and gastric emptying study, however not until the end of the month.  She is frustrated and does not want to continue with the symptoms until the procedures are done.  She came to the ER in hopes that there was something we could do to give her answers as to why she is having the symptoms daily.  She denies any fever or chills.  No back pain or urinary symptoms.  She states that her symptoms are actually resolved now this morning, as this only occurs at night.  No chest pain or shortness of breath.  History reviewed. No pertinent past medical history.  Patient Active Problem List   Diagnosis Date Noted  . Status post primary low transverse cesarean section 06/10/2013  . Preeclampsia 06/07/2013    Past Surgical History:  Procedure Laterality Date  . CESAREAN SECTION N/A 06/09/2013   Procedure: CESAREAN  SECTION;  Surgeon: Purcell Nails, MD;  Location: [facility] ORS;  Service: Obstetrics;  Laterality: N/A;  . EYE SURGERY       OB History    Gravida  1   Para  1   Term      Preterm  1   AB      Living  1     SAB      TAB      Ectopic      Multiple      Live Births  1            Home Medications    Prior to Admission medications   Medication Sig Start Date End Date Taking? Authorizing Provider  acetaminophen (TYLENOL) 325 MG tablet Take 650 mg by mouth every 6 (six) hours as needed for mild pain.    [provider]  famotidine (PEPCID) 20 MG tablet Take 1 tablet (20 mg total) by mouth at bedtime. 11/28/17   Ward, Chase Picket, PA-C  omeprazole (PRILOSEC) 20 MG capsule Take 1 capsule (20 mg total) by mouth daily. 07/17/17   Palumbo, April, MD    Family History Family History  Problem Relation Age of Onset  . Hypertension Mother   . Arthritis Father   . Diabetes Father   . Hypertension Father     Social History Social History  Tobacco Use  . Smoking status: Never Smoker  . Smokeless tobacco: Never Used  Substance Use Topics  . Alcohol use: No  . Drug use: No     Allergies   Patient has no known allergies.   Review of Systems Review of Systems  Gastrointestinal: Positive for abdominal pain, nausea and vomiting.  All other systems reviewed and are negative.    Physical Exam Updated Vital Signs BP 132/84 (BP Location: Left Arm)   Pulse 61   Temp 98.4 F (36.9 C) (Oral)   Resp 16   Ht 5\' 4"  (1.626 m)   Wt 70.8 kg   SpO2 100%   BMI 26.78 kg/m   Physical Exam  Constitutional: She is oriented to person, place, and time. She appears well-developed and well-nourished. No distress.  Well-appearing, comfortable resting in the bed.  HENT:  Head: Normocephalic and atraumatic.  Cardiovascular: Normal rate, regular rhythm and normal heart sounds.  No murmur heard. Pulmonary/Chest: Effort normal and breath sounds normal. No respiratory  distress.  Abdominal: Soft. She exhibits no distension.  No abdominal or CVA tenderness.  Musculoskeletal: She exhibits no edema.  Neurological: She is alert and oriented to person, place, and time.  Skin: Skin is warm and dry.  Nursing note and vitals reviewed.    ED Treatments / Results  Labs (all labs ordered are listed, but only abnormal results are displayed) Labs Reviewed  URINALYSIS, ROUTINE W REFLEX MICROSCOPIC - Abnormal; Notable for the following components:      Result Value   Color, Urine COLORLESS (*)    Specific Gravity, Urine 1.001 (*)    All other components within normal limits  LIPASE, BLOOD  COMPREHENSIVE METABOLIC PANEL  CBC  I-STAT BETA HCG BLOOD, ED ([facility], [facility], [facility] ONLY)    EKG None  Radiology No results found.  Procedures Procedures (including critical care time)  Medications Ordered in ED Medications - No data to display   Initial Impression / Assessment and Plan / ED Course  I have reviewed the triage vital signs and the nursing notes.  Pertinent labs & imaging results that were available during my care of the patient were reviewed by me and considered in my medical decision making (see chart for details).    Katelyn Hinton is a 32 y.o. female who presents to ED for epigastric pain which occurs every night for the last year.  Chart reviewed extensively.  She is now followed with GI who diagnosed her with H. pylori.  She has finished her antibiotic course, but still is having symptoms.  GI plan is to obtain gastric emptying study and EGD which is scheduled for the end of this month.  On exam today, patient is afebrile, hemodynamically stable and well-appearing.  She has no abdominal tenderness.  She states that her symptoms only occur at night, and as it is the morning, she is currently pain-free.  Her labs were reviewed and reassuring.  Normal white count, kidney and liver function, lipase. Evaluation does not show pathology that would require ongoing  emergent intervention or inpatient treatment.  Will add Pepcid at night to her PPI therapy and have her continue to follow-up with her GI doctor later this month as scheduled.  Reasons to return to the ER were discussed and all questions answered.   Final Clinical Impressions(s) / ED Diagnoses   Final diagnoses:  Epigastric pain    ED Discharge Orders         Ordered    famotidine (PEPCID)  20 MG tablet  Daily at bedtime     11/28/17 0715           Ward, Chase Picket, PA-C 11/28/17 0725    Rolan Bucco, MD 11/28/17 [phone]

## 2017-11-28 NOTE — Discharge Instructions (Signed)
It was my pleasure taking care of you today!   Continue taking your Prilosec in the morning and nausea medication as needed.  Begin taking Pepcid at bedtime.   Call your GI doctor to discuss today's visit. Hopefully you can get a sooner appointment.   Return to ER for new or worsening symptoms, any additional concerns.

## 2017-11-28 NOTE — ED Triage Notes (Signed)
Pt reports being diagnosed with acid reflux and H.Pylori. Pt reports this has been ongoing for the last year. Pt reports vomiting at least 7-8 times a night.

## 2018-02-23 DIAGNOSIS — K8020 Calculus of gallbladder without cholecystitis without obstruction: Secondary | ICD-10-CM

## 2018-02-23 HISTORY — DX: Calculus of gallbladder without cholecystitis without obstruction: K80.20

## 2018-07-01 ENCOUNTER — Inpatient Hospital Stay (HOSPITAL_COMMUNITY)
Admission: AD | Admit: 2018-07-01 | Discharge: 2018-07-01 | Disposition: A | Discharge status: Home / Self Care | Payer: Medicaid Other | Attending: Obstetrics and Gynecology | Admitting: Obstetrics and Gynecology

## 2018-07-01 ENCOUNTER — Other Ambulatory Visit: Payer: Self-pay

## 2018-07-01 ENCOUNTER — Inpatient Hospital Stay (HOSPITAL_COMMUNITY): Payer: Medicaid Other

## 2018-07-01 ENCOUNTER — Encounter (HOSPITAL_COMMUNITY): Payer: Self-pay | Admitting: *Deleted

## 2018-07-01 DIAGNOSIS — O208 Other hemorrhage in early pregnancy: Secondary | ICD-10-CM | POA: Insufficient documentation

## 2018-07-01 DIAGNOSIS — O209 Hemorrhage in early pregnancy, unspecified: Secondary | ICD-10-CM

## 2018-07-01 DIAGNOSIS — O418X10 Other specified disorders of amniotic fluid and membranes, first trimester, not applicable or unspecified: Secondary | ICD-10-CM

## 2018-07-01 DIAGNOSIS — Z3A08 8 weeks gestation of pregnancy: Secondary | ICD-10-CM | POA: Diagnosis not present

## 2018-07-01 DIAGNOSIS — O09521 Supervision of elderly multigravida, first trimester: Secondary | ICD-10-CM | POA: Diagnosis not present

## 2018-07-01 DIAGNOSIS — O34219 Maternal care for unspecified type scar from previous cesarean delivery: Secondary | ICD-10-CM | POA: Diagnosis not present

## 2018-07-01 DIAGNOSIS — R102 Pelvic and perineal pain: Secondary | ICD-10-CM | POA: Diagnosis present

## 2018-07-01 DIAGNOSIS — O468X1 Other antepartum hemorrhage, first trimester: Secondary | ICD-10-CM | POA: Diagnosis not present

## 2018-07-01 HISTORY — DX: Calculus of gallbladder without cholecystitis without obstruction: K80.20

## 2018-07-01 HISTORY — DX: Unspecified convulsions: R56.9

## 2018-07-01 HISTORY — DX: Anemia, unspecified: D64.9

## 2018-07-01 HISTORY — DX: Gestational (pregnancy-induced) hypertension without significant proteinuria, unspecified trimester: O13.9

## 2018-07-01 LAB — URINALYSIS, ROUTINE W REFLEX MICROSCOPIC
Bilirubin Urine: NEGATIVE
Glucose, UA: NEGATIVE mg/dL
Ketones, ur: 80 mg/dL — AB
Leukocytes,Ua: NEGATIVE
Nitrite: NEGATIVE
Protein, ur: 100 mg/dL — AB
RBC / HPF: >50 RBC/hpf — ABNORMAL HIGH (ref 0–5)
Specific Gravity, Urine: 1.029 (ref 1.005–1.030)
pH: 5 (ref 5.0–8.0)

## 2018-07-01 LAB — CBC
HCT: 35 % — ABNORMAL LOW (ref 36.0–46.0)
Hemoglobin: 11.9 g/dL — ABNORMAL LOW (ref 12.0–15.0)
MCH: 28 pg (ref 26.0–34.0)
MCHC: 34 g/dL (ref 30.0–36.0)
MCV: 82.4 fL (ref 80.0–100.0)
Platelets: 262 10*3/uL (ref 150–400)
RBC: 4.25 MIL/uL (ref 3.87–5.11)
RDW: 14.5 % (ref 11.5–15.5)
WBC: 4.7 10*3/uL (ref 4.0–10.5)
nRBC: 0 % (ref 0.0–0.2)

## 2018-07-01 LAB — PREGNANCY, URINE: Preg Test, Ur: POSITIVE — AB

## 2018-07-01 LAB — HCG, QUANTITATIVE, PREGNANCY: hCG, Beta Chain, Quant, S: 213026 m[IU]/mL — ABNORMAL HIGH (ref <5)

## 2018-07-01 NOTE — MAU Provider Note (Addendum)
Chief Complaint: Vaginal Bleeding and Pelvic Pain   First Provider Initiated Contact with Patient 07/01/18 1854      SUBJECTIVE HPI: Katelyn Hinton is a 33 y.o. G2P0101 at 3232w6d by sure LMP who presents to maternity admissions reporting onset of dark red vaginal bleeding requiring a pad starting today. There is no pain. There are no other symptoms.  She has not tried any treatments. Last intercourse 1+ week ago.    HPI  Past Medical History:  Diagnosis Date  . Anemia   . Gallstones 2020   was to have surgery 07/08/18  . Pregnancy induced hypertension   . Preterm labor    [facility]  . Seizures [facility])    age 6mon, 1 time thing   Past Surgical History:  Procedure Laterality Date  . CESAREAN SECTION N/A 06/09/2013   Procedure: CESAREAN SECTION;  Surgeon: Purcell NailsAngela Y Roberts, MD;  Location: [facility] ORS;  Service: Obstetrics;  Laterality: N/A;  . EYE SURGERY     Social History   Socioeconomic History  . Marital status: Married    Spouse name: Not on file  . Number of children: Not on file  . Years of education: Not on file  . Highest education level: Not on file  Occupational History  . Not on file  Social Needs  . Financial resource strain: Not on file  . Food insecurity:    Worry: Not on file    Inability: Not on file  . Transportation needs:    Medical: Not on file    Non-medical: Not on file  Tobacco Use  . Smoking status: Never Smoker  . Smokeless tobacco: Never Used  Substance and Sexual Activity  . Alcohol use: No  . Drug use: No  . Sexual activity: Yes  Lifestyle  . Physical activity:    Days per week: Not on file    Minutes per session: Not on file  . Stress: Not on file  Relationships  . Social connections:    Talks on phone: Not on file    Gets together: Not on file    Attends religious service: Not on file    Active member of club or organization: Not on file    Attends meetings of clubs or organizations: Not on file    Relationship status: Not on file   . Intimate partner violence:    Fear of current or ex partner: Not on file    Emotionally abused: Not on file    Physically abused: Not on file    Forced sexual activity: Not on file  Other Topics Concern  . Not on file  Social History Narrative  . Not on file   No current facility-administered medications on file prior to encounter.    Current Outpatient Medications on File Prior to Encounter  Medication Sig Dispense Refill  . acetaminophen (TYLENOL) 325 MG tablet Take 650 mg by mouth every 6 (six) hours as needed for mild pain.    . famotidine (PEPCID) 20 MG tablet Take 1 tablet (20 mg total) by mouth at bedtime. 30 tablet 0  . omeprazole (PRILOSEC) 20 MG capsule Take 1 capsule (20 mg total) by mouth daily. 30 capsule 0   No Known Allergies  ROS:  Review of Systems  Constitutional: Negative for chills, fatigue and fever.  Respiratory: Negative for shortness of breath.   Cardiovascular: Negative for chest pain.  Gastrointestinal: Negative for nausea and vomiting.  Genitourinary: Positive for vaginal bleeding. Negative for difficulty urinating, dysuria,  flank pain, pelvic pain, vaginal discharge and vaginal pain.  Neurological: Negative for dizziness and headaches.  Psychiatric/Behavioral: Negative.      I have reviewed patient's Past Medical Hx, Surgical Hx, Family Hx, Social Hx, medications and allergies.   Physical Exam   Patient Vitals for the past 24 hrs:  BP Temp Temp src Pulse Resp SpO2 Height Weight  07/01/18 1746 117/71 99.2 F (37.3 C) Oral 85 17 100 %  (1.575 m) 66.7 kg   Constitutional: Well-developed, well-nourished female in no acute distress.  Cardiovascular: normal rate Respiratory: normal effort GI: Abd soft, non-tender. Pos BS x 4 MS: Extremities nontender, no edema, normal ROM Neurologic: Alert and oriented x 4.  GU: Neg CVAT.  PELVIC EXAM: Cervix pink, visually closed, friable to cotton swab, small amount dark red bleeding requiring 1 fox  swab for visualization of cervix, vaginal walls and external genitalia normal Bimanual exam: Cervix 0/long/high, firm, anterior, neg CMT, uterus nontender, ~ 8 week size, adnexa without tenderness, enlargement, or mass   LAB RESULTS Results for orders placed or performed during the hospital encounter of 07/01/18 (from the past 24 hour(s))  Urinalysis, Routine w reflex microscopic     Status: Abnormal   Collection Time: 07/01/18  5:52 PM  Result Value Ref Range   Color, Urine AMBER (A) YELLOW   APPearance HAZY (A) CLEAR   Specific Gravity, Urine 1.029 1.005 - 1.030   pH 5.0 5.0 - 8.0   Glucose, UA NEGATIVE NEGATIVE mg/dL   Hgb urine dipstick LARGE (A) NEGATIVE   Bilirubin Urine NEGATIVE NEGATIVE   Ketones, ur 80 (A) NEGATIVE mg/dL   Protein, ur 409 (A) NEGATIVE mg/dL   Nitrite NEGATIVE NEGATIVE   Leukocytes,Ua NEGATIVE NEGATIVE   RBC / HPF >50 (H) 0 - 5 RBC/hpf   WBC, UA 0-5 0 - 5 WBC/hpf   Bacteria, UA RARE (A) NONE SEEN   Squamous Epithelial / LPF 0-5 0 - 5   Mucus PRESENT   Pregnancy, urine     Status: Abnormal   Collection Time: 07/01/18  6:10 PM  Result Value Ref Range   Preg Test, Ur POSITIVE (A) NEGATIVE  CBC     Status: Abnormal   Collection Time: 07/01/18  6:29 PM  Result Value Ref Range   WBC 4.7 4.0 - 10.5 K/uL   RBC 4.25 3.87 - 5.11 MIL/uL   Hemoglobin 11.9 (L) 12.0 - 15.0 g/dL   HCT 81.1 (L) 91.4 - 78.2 %   MCV 82.4 80.0 - 100.0 fL   MCH 28.0 26.0 - 34.0 pg   MCHC 34.0 30.0 - 36.0 g/dL   RDW 95.6 21.3 - 08.6 %   Platelets 262 150 - 400 K/uL   nRBC 0.0 0.0 - 0.2 %  hCG, quantitative, pregnancy     Status: Abnormal   Collection Time: 07/01/18  6:29 PM  Result Value Ref Range   hCG, Beta Chain, Quant, S 213,026 (H) <5 mIU/mL       IMAGING US Ob Comp Less 14 Wks  Result Date: 07/01/2018 CLINICAL DATA:  Vaginal bleeding, positive pregnancy test EXAM: OBSTETRIC <14 WK ULTRASOUND TECHNIQUE: Transabdominal ultrasound was performed for evaluation of the  gestation as well as the maternal uterus and adnexal regions. COMPARISON:  None. FINDINGS: Intrauterine gestational sac: Present Yolk sac:  Present Embryo:  Present Cardiac Activity: Present Heart Rate: 171 bpm CRL:   21 mm   8 w 4 d  [place] EDC: 02/06/2019 Subchorionic hemorrhage:  Small subchorionic hemorrhage is noted. Maternal uterus/adnexae: 4.2 cm cyst is noted in the left ovary. IMPRESSION: Single live intrauterine gestation at 8 weeks 4 days. Small subchorionic hemorrhage is noted. Prominent left ovarian cyst is seen. Electronically Signed   By: Alcide Clever M.D.   On: 07/01/2018 20:08    MAU Management/MDM: Orders Placed This Encounter  Procedures  . US OB Comp Less 14 Wks  . Urinalysis, Routine w reflex microscopic  . Pregnancy, urine  . CBC  . hCG, quantitative, pregnancy  . HIV Antibody (routine testing w rflx)  . RPR  . Discharge patient    No orders of the defined types were placed in this encounter.   US shows live IUP with small SCH. Reviewed results with pt.  Bleeding precautions given. Blood type O positive.  Pt to f/u in office for prenatal care, return to MAU for emergencies.  ASSESSMENT 1. Subchorionic hemorrhage of placenta in first trimester, single or unspecified fetus   2. Vaginal bleeding in pregnancy, first trimester     PLAN Discharge home with bleeding precautions  Allergies as of 07/01/2018   No Known Allergies     Medication List    TAKE these medications   acetaminophen 325 MG tablet Commonly known as:  TYLENOL Take 650 mg by mouth every 6 (six) hours as needed for mild pain.   famotidine 20 MG tablet Commonly known as:  PEPCID Take 1 tablet (20 mg total) by mouth at bedtime.   omeprazole 20 MG capsule Commonly known as:  PRILOSEC Take 1 capsule (20 mg total) by mouth daily.      Follow-up Information    Meisinger, Tawanna Cooler, MD Follow up.   Specialty:  Obstetrics and Gynecology Why:  For routine prenatal care as planned. Return  to MAU with emergencies. Contact information: [address] [place] [address] [phone]           Sharen Counter Certified Nurse-Midwife 07/01/2018  9:16 PM

## 2018-07-01 NOTE — MAU Note (Signed)
Is 8 wks preg and is bleeding.  Started bleeding at 1622. Pain in perineum, tight- muscle pain. Pain started at 1635 after she talked to the OB.

## 2018-07-02 LAB — HIV ANTIBODY (ROUTINE TESTING W REFLEX): HIV Screen 4th Generation wRfx: NONREACTIVE

## 2018-07-02 LAB — RPR: RPR Ser Ql: NONREACTIVE

## 2018-07-13 LAB — OB RESULTS CONSOLE RUBELLA ANTIBODY, IGM: Rubella: IMMUNE

## 2018-07-13 LAB — OB RESULTS CONSOLE HEPATITIS B SURFACE ANTIGEN: Hepatitis B Surface Ag: NEGATIVE

## 2018-09-28 ENCOUNTER — Other Ambulatory Visit: Payer: Self-pay

## 2018-09-28 ENCOUNTER — Encounter: Payer: Self-pay | Admitting: Dietician

## 2018-09-28 ENCOUNTER — Encounter: Payer: Medicaid Other | Attending: Obstetrics and Gynecology | Admitting: Dietician

## 2018-09-28 DIAGNOSIS — R7309 Other abnormal glucose: Secondary | ICD-10-CM | POA: Diagnosis present

## 2018-09-28 NOTE — Progress Notes (Signed)
Diabetes Self-Management Education  Visit Type: First/Initial  Appt. Start Time: 2:00pm Appt. End Time: 3:00pm  09/28/2018  [name], identified by name and date of birth, is a 33 y.o. female with a diagnosis of Diabetes: Gestational Diabetes.   Patient was seen on 09/28/2018 for Gestational Diabetes self-management education at the [facility]. The following learning objectives were met by the patient during this course:   States the definition of Gestational Diabetes  States why dietary management is important in controlling blood glucose  Describes the effects each nutrient has on blood glucose levels  Demonstrates ability to create a balanced meal plan  Demonstrates carbohydrate counting   States when to check blood glucose levels  Demonstrates proper blood glucose monitoring techniques  States the effect of stress and exercise on blood glucose levels  States the importance of limiting caffeine and abstaining from alcohol and smoking  Blood glucose monitor given: N/A (pt already has one, covered by insurance)  Patient instructed to monitor glucose levels: FBS: 60 - <95; 1 hour: <140; 2 hour: <120 *After meals, pt checks at 1 hour per MD recommendations   ASSESSMENT  Diabetes Self-Management Education - 09/28/18 1408      Visit Information   Visit Type  First/Initial      Initial Visit   Diabetes Type  Gestational Diabetes    Are you currently following a meal plan?  No    Are you taking your medications as prescribed?  Not on Medications    Date Diagnosed  July 2020      Health Coping   How would you rate your overall health?  Excellent      Psychosocial Assessment   Patient Belief/Attitude about Diabetes  Motivated to manage diabetes    Self-care barriers  None    Self-management support  Doctor's office    Other persons present  Patient    Patient Concerns  Nutrition/Meal planning;Glycemic Control    Special Needs   None    Preferred Learning Style  No preference indicated    Learning Readiness  Ready    How often do you need to have someone help you when you read instructions, pamphlets, or other written materials from your doctor or pharmacy?  1 - Never    What is the last grade level you completed in school?  56EP      Complications   How often do you check your blood sugar?  3-4 times/day      Dietary Intake   Breakfast  2 slices wheat bread + eggs    Lunch  spaghetti + salad    Dinner  lentils + spinach + veggies + meat (chicken, salmon, or beef)    Snack (evening)  sometimes a snack    Beverage(s)  water      Exercise   Exercise Type  ADL's;Light (walking / raking leaves)    How many days per week to you exercise?  4    How many minutes per day do you exercise?  45    Total minutes per week of exercise  180      Patient Education   Previous Diabetes Education  No    Preconception care  Pregnancy and GDM  Role of pre-pregnancy blood glucose control on the development of the fetus;Reviewed with patient blood glucose goals with pregnancy      Outcomes   Expected Outcomes  Demonstrated interest in learning. Expect positive outcomes    Future DMSE  PRN    Program Status  Completed       Individualized Plan for Diabetes Self-Management Training:  Learning Objective:  Patient will have a greater understanding of diabetes self-management. Patient education plan is to attend individual and/or group sessions per assessed needs and concerns.  Patient received handouts:  Nutrition Diabetes and Pregnancy, including carb counting list  Blood Glucose Log  Expected Outcomes:  Demonstrated interest in learning. Expect positive outcomes  If problems or questions, patient to contact team via:  Phone and Email  Future DSME appointment: PRN

## 2018-12-17 ENCOUNTER — Inpatient Hospital Stay (HOSPITAL_COMMUNITY)
Admission: AD | Admit: 2018-12-17 | Discharge: 2018-12-18 | Disposition: A | Discharge status: Home / Self Care | Payer: Medicaid Other | Attending: Obstetrics and Gynecology | Admitting: Obstetrics and Gynecology

## 2018-12-17 ENCOUNTER — Encounter (HOSPITAL_COMMUNITY): Payer: Self-pay | Admitting: *Deleted

## 2018-12-17 ENCOUNTER — Other Ambulatory Visit: Payer: Self-pay

## 2018-12-17 DIAGNOSIS — Z79899 Other long term (current) drug therapy: Secondary | ICD-10-CM | POA: Insufficient documentation

## 2018-12-17 DIAGNOSIS — Z3A33 33 weeks gestation of pregnancy: Secondary | ICD-10-CM | POA: Diagnosis not present

## 2018-12-17 DIAGNOSIS — Z3689 Encounter for other specified antenatal screening: Secondary | ICD-10-CM | POA: Diagnosis not present

## 2018-12-17 DIAGNOSIS — R110 Nausea: Secondary | ICD-10-CM | POA: Diagnosis not present

## 2018-12-17 DIAGNOSIS — O09523 Supervision of elderly multigravida, third trimester: Secondary | ICD-10-CM | POA: Diagnosis not present

## 2018-12-17 DIAGNOSIS — O4703 False labor before 37 completed weeks of gestation, third trimester: Secondary | ICD-10-CM | POA: Insufficient documentation

## 2018-12-17 DIAGNOSIS — D649 Anemia, unspecified: Secondary | ICD-10-CM | POA: Insufficient documentation

## 2018-12-17 DIAGNOSIS — O99013 Anemia complicating pregnancy, third trimester: Secondary | ICD-10-CM | POA: Insufficient documentation

## 2018-12-17 DIAGNOSIS — O26893 Other specified pregnancy related conditions, third trimester: Secondary | ICD-10-CM | POA: Diagnosis not present

## 2018-12-17 DIAGNOSIS — O24419 Gestational diabetes mellitus in pregnancy, unspecified control: Secondary | ICD-10-CM | POA: Insufficient documentation

## 2018-12-17 DIAGNOSIS — R109 Unspecified abdominal pain: Secondary | ICD-10-CM | POA: Diagnosis not present

## 2018-12-17 LAB — URINALYSIS, ROUTINE W REFLEX MICROSCOPIC
Bilirubin Urine: NEGATIVE
Glucose, UA: >=500 mg/dL — AB
Hgb urine dipstick: NEGATIVE
Ketones, ur: NEGATIVE mg/dL
Nitrite: NEGATIVE
Protein, ur: NEGATIVE mg/dL
Specific Gravity, Urine: 1 — ABNORMAL LOW (ref 1.005–1.030)
pH: 7 (ref 5.0–8.0)

## 2018-12-17 LAB — GLUCOSE, CAPILLARY: Glucose-Capillary: 134 mg/dL — ABNORMAL HIGH (ref 70–99)

## 2018-12-17 NOTE — MAU Provider Note (Signed)
History     CSN: [id number]  Arrival date and time: 12/17/18 2211   First Provider Initiated Contact with Patient 12/17/18 2323      Chief Complaint  Patient presents with  . Contractions   Katelyn Hinton is a 33 y.o. G2P0101 at [redacted]w[redacted]d who receives care at [facility].  She presents today for Contractions.  She states she has started having cramping around 12pm and took tylenol with codeine, which causes some relief.  She states she is having cramping every 4-5 minutes that is worse when she stands or walks.  She reports fetal movement and denies vaginal discharge, bleeding, or leaking.  She denies sexual activity in the last 3 days.  Patient reports that she ate rice prior to arrival.      OB History    Gravida  2   Para  1   Term      Preterm  1   AB      Living  1     SAB      TAB      Ectopic      Multiple      Live Births  1           Past Medical History:  Diagnosis Date  . Anemia   . Gallstones 2020   was to have surgery 07/08/18  . Pregnancy induced hypertension   . Preterm labor    due to [facility]  . Seizures [facility])    age 60mon, 1 time thing    Past Surgical History:  Procedure Laterality Date  . CESAREAN SECTION N/A 06/09/2013   Procedure: CESAREAN SECTION;  Surgeon: Purcell Nails, MD;  Location: [facility];  Service: Obstetrics;  Laterality: N/A;  . EYE SURGERY      Family History  Problem Relation Age of Onset  . Hypertension Mother   . Asthma Mother   . Arthritis Father   . Diabetes Father   . Hypertension Father     Social History   Tobacco Use  . Smoking status: Never Smoker  . Smokeless tobacco: Never Used  Substance Use Topics  . Alcohol use: No  . Drug use: No    Allergies: No Known Allergies  Medications Prior to Admission  Medication Sig Dispense Refill Last Dose  . ACETAMINOPHEN-CODEINE PO Take by mouth.   12/17/2018 at Unknown time  . omeprazole (PRILOSEC) 20 MG capsule Take 1 capsule (20 mg total) by mouth  daily. 30 capsule 0 12/17/2018 at Unknown time  . Prenatal Vit-Fe Fumarate-FA (MULTIVITAMIN-PRENATAL) 27-0.8 MG TABS tablet Take 1 tablet by mouth daily at 12 noon.   12/17/2018 at Unknown time  . acetaminophen (TYLENOL) 325 MG tablet Take 650 mg by mouth every 6 (six) hours as needed for mild pain.   Unknown at Unknown time  . famotidine (PEPCID) 20 MG tablet Take 1 tablet (20 mg total) by mouth at bedtime. 30 tablet 0 Unknown at Unknown time    Review of Systems  Constitutional: Negative for chills and fever.  Respiratory: Negative for cough and shortness of breath.   Gastrointestinal: Positive for abdominal pain and nausea. Negative for vomiting.  Genitourinary: Negative for difficulty urinating, dysuria, vaginal bleeding and vaginal discharge.  Neurological: Negative for dizziness, light-headedness and headaches.   Physical Exam   Blood pressure 124/77, pulse (!) 102, temperature 98.4 F (36.9 C), temperature source Oral, resp. rate 19, weight 82.7 kg, last menstrual period 04/30/2018, unknown if currently breastfeeding.  Physical Exam  Constitutional:  She is oriented to person, place, and time. She appears well-developed and well-nourished.  HENT:  Head: Normocephalic and atraumatic.  Eyes: Conjunctivae are normal.  Neck: Normal range of motion.  Cardiovascular: Normal rate.  Respiratory: Effort normal.  GI: Soft.  Musculoskeletal: Normal range of motion.  Neurological: She is alert and oriented to person, place, and time.  Skin: Skin is warm and dry.  Psychiatric: She has a normal mood and affect. Her behavior is normal.    Fetal Assessment 150 bpm, Mod Var, -Decels, +Accels Toco: Irritability   MAU Course   Results for orders placed or performed during the hospital encounter of 12/17/18 (from the past 24 hour(s))  Urinalysis, Routine w reflex microscopic     Status: Abnormal   Collection Time: 12/17/18 10:34 PM  Result Value Ref Range   Color, Urine COLORLESS (A)  YELLOW   APPearance CLEAR CLEAR   Specific Gravity, Urine 1.000 (L) 1.005 - 1.030   pH 7.0 5.0 - 8.0   Glucose, UA >=500 (A) NEGATIVE mg/dL   Hgb urine dipstick NEGATIVE NEGATIVE   Bilirubin Urine NEGATIVE NEGATIVE   Ketones, ur NEGATIVE NEGATIVE mg/dL   Protein, ur NEGATIVE NEGATIVE mg/dL   Nitrite NEGATIVE NEGATIVE   Leukocytes,Ua SMALL (A) NEGATIVE   RBC / HPF 0-5 0 - 5 RBC/hpf   WBC, UA 0-5 0 - 5 WBC/hpf   Bacteria, UA RARE (A) NONE SEEN   Squamous Epithelial / LPF 0-5 0 - 5  Wet prep, genital     Status: Abnormal   Collection Time: 12/17/18 11:35 PM  Result Value Ref Range   Yeast Wet Prep HPF POC NONE SEEN NONE SEEN   Trich, Wet Prep NONE SEEN NONE SEEN   Clue Cells Wet Prep HPF POC NONE SEEN NONE SEEN   WBC, Wet Prep HPF POC MANY (A) NONE SEEN   Sperm NONE SEEN   Glucose, capillary     Status: Abnormal   Collection Time: 12/17/18 11:52 PM  Result Value Ref Range   Glucose-Capillary 134 (H) 70 - 99 mg/dL   No results found.  MDM PE Labs:Wet Prep, GC/CT EFM  Assessment and Plan  33 year old G83P0101  SIUP at 33.1weeks Cat I FT Abdominal Cramping GDM-A2  -POC reviewed. -Patient given saltines and ginger ale for nausea. -Exam performed and findings discussed. -Extensive discussion regarding normal and anticipated cramping during pregnancy. -Questions addressed regarding how to differentiate labor pains from gallstone attack.  -Will collect CBG prior to eating. -Cultures collected and pending.  -NST Reactive -Will await results.   Maryann Conners MSN, CNM 12/17/2018, 11:23 PM   Reassessment (12:36 AM)  -Patient reports improvement of nausea with ginger ale and crackers. -Reports pain has subsided, but states "it will come back when I stand up." -Discussed usage of tylenol at home for pain. -Wet prep returns with insignificant findings. -Results discussed with patient. -Informed that GC/CT will return within 2-3 days. -Preterm Labor Precautions  Discussed. -Encouraged to call or return to MAU if symptoms worsen or with the onset of new symptoms. -Discharged to home in stable condition.  Maryann Conners MSN, CNM Advanced Practice Provider, [facility] for [employer] [facility]

## 2018-12-17 NOTE — MAU Note (Signed)
Pt reports to MAU c/o ctxs pt states it has progressively gotten worse today. It started today at 1200. Pt denies vaginal bleeding. Pt reports a watery discharge that was 3 days ago none since then. +FM.

## 2018-12-18 LAB — WET PREP, GENITAL
Clue Cells Wet Prep HPF POC: NONE SEEN
Sperm: NONE SEEN
Trich, Wet Prep: NONE SEEN
Yeast Wet Prep HPF POC: NONE SEEN

## 2018-12-18 NOTE — Discharge Instructions (Signed)
Abdominal Pain During Pregnancy ° °Abdominal pain is common during pregnancy, and has many possible causes. Some causes are more serious than others, and sometimes the cause is not known. Abdominal pain can be a sign that labor is starting. It can also be caused by normal growth and stretching of muscles and ligaments during pregnancy. Always tell your health care provider if you have any abdominal pain. °Follow these instructions at home: °· Do not have sex or put anything in your vagina until your pain goes away completely. °· Get plenty of rest until your pain improves. °· Drink enough fluid to keep your urine pale yellow. °· Take over-the-counter and prescription medicines only as told by your health care provider. °· Keep all follow-up visits as told by your health care provider. This is important. °Contact a health care provider if: °· Your pain continues or gets worse after resting. °· You have lower abdominal pain that: °? Comes and goes at regular intervals. °? Spreads to your back. °? Is similar to menstrual cramps. °· You have pain or burning when you urinate. °Get help right away if: °· You have a fever or chills. °· You have vaginal bleeding. °· You are leaking fluid from your vagina. °· You are passing tissue from your vagina. °· You have vomiting or diarrhea that lasts for more than 24 hours. °· Your baby is moving less than usual. °· You feel very weak or faint. °· You have shortness of breath. °· You develop severe pain in your upper abdomen. °Summary °· Abdominal pain is common during pregnancy, and has many possible causes. °· If you experience abdominal pain during pregnancy, tell your health care provider right away. °· Follow your health care provider's home care instructions and keep all follow-up visits as directed. °This information is not intended to replace advice given to you by your health care provider. Make sure you discuss any questions you have with your health care  provider. °Document Released: 02/09/2005 Document Revised: 05/30/2018 Document Reviewed: 05/14/2016 °Elsevier Patient Education © 2020 [employer] Inc[employer] ° °

## 2018-12-20 LAB — GC/CHLAMYDIA PROBE AMP (~~LOC~~) NOT AT ARMC
Chlamydia: NEGATIVE
Comment: NEGATIVE
Comment: NORMAL
Neisseria Gonorrhea: NEGATIVE

## 2019-01-04 LAB — OB RESULTS CONSOLE GBS: GBS: NEGATIVE

## 2019-01-24 ENCOUNTER — Other Ambulatory Visit: Payer: Self-pay

## 2019-01-24 ENCOUNTER — Inpatient Hospital Stay (HOSPITAL_COMMUNITY)
Admission: AD | Admit: 2019-01-24 | Discharge: 2019-01-30 | DRG: 788 | Disposition: A | Discharge status: Home / Self Care | Payer: Medicaid Other | Attending: Obstetrics and Gynecology | Admitting: Obstetrics and Gynecology

## 2019-01-24 ENCOUNTER — Inpatient Hospital Stay (HOSPITAL_COMMUNITY): Payer: Medicaid Other | Admitting: Anesthesiology

## 2019-01-24 ENCOUNTER — Encounter (HOSPITAL_COMMUNITY)
Admission: AD | Disposition: A | Discharge status: Home / Self Care | Payer: Self-pay | Source: Home / Self Care | Attending: Obstetrics and Gynecology

## 2019-01-24 ENCOUNTER — Encounter (HOSPITAL_COMMUNITY): Payer: Self-pay | Admitting: *Deleted

## 2019-01-24 DIAGNOSIS — O34211 Maternal care for low transverse scar from previous cesarean delivery: Secondary | ICD-10-CM | POA: Diagnosis present

## 2019-01-24 DIAGNOSIS — O9902 Anemia complicating childbirth: Secondary | ICD-10-CM | POA: Diagnosis present

## 2019-01-24 DIAGNOSIS — O1413 Severe pre-eclampsia, third trimester: Secondary | ICD-10-CM | POA: Diagnosis not present

## 2019-01-24 DIAGNOSIS — O9962 Diseases of the digestive system complicating childbirth: Secondary | ICD-10-CM | POA: Diagnosis present

## 2019-01-24 DIAGNOSIS — Z20828 Contact with and (suspected) exposure to other viral communicable diseases: Secondary | ICD-10-CM | POA: Diagnosis present

## 2019-01-24 DIAGNOSIS — Z362 Encounter for other antenatal screening follow-up: Secondary | ICD-10-CM

## 2019-01-24 DIAGNOSIS — O1414 Severe pre-eclampsia complicating childbirth: Secondary | ICD-10-CM | POA: Diagnosis present

## 2019-01-24 DIAGNOSIS — D649 Anemia, unspecified: Secondary | ICD-10-CM | POA: Diagnosis present

## 2019-01-24 DIAGNOSIS — K8020 Calculus of gallbladder without cholecystitis without obstruction: Secondary | ICD-10-CM | POA: Diagnosis present

## 2019-01-24 DIAGNOSIS — Z3A38 38 weeks gestation of pregnancy: Secondary | ICD-10-CM | POA: Diagnosis not present

## 2019-01-24 DIAGNOSIS — O24425 Gestational diabetes mellitus in childbirth, controlled by oral hypoglycemic drugs: Secondary | ICD-10-CM | POA: Diagnosis present

## 2019-01-24 DIAGNOSIS — R030 Elevated blood-pressure reading, without diagnosis of hypertension: Secondary | ICD-10-CM | POA: Diagnosis present

## 2019-01-24 HISTORY — DX: Gestational diabetes mellitus in pregnancy, unspecified control: O24.419

## 2019-01-24 LAB — CBC
HCT: 29.8 % — ABNORMAL LOW (ref 36.0–46.0)
HCT: 31 % — ABNORMAL LOW (ref 36.0–46.0)
Hemoglobin: 8.9 g/dL — ABNORMAL LOW (ref 12.0–15.0)
Hemoglobin: 9.2 g/dL — ABNORMAL LOW (ref 12.0–15.0)
MCH: 23.7 pg — ABNORMAL LOW (ref 26.0–34.0)
MCH: 23.8 pg — ABNORMAL LOW (ref 26.0–34.0)
MCHC: 29.7 g/dL — ABNORMAL LOW (ref 30.0–36.0)
MCHC: 29.9 g/dL — ABNORMAL LOW (ref 30.0–36.0)
MCV: 79.7 fL — ABNORMAL LOW (ref 80.0–100.0)
MCV: 79.7 fL — ABNORMAL LOW (ref 80.0–100.0)
Platelets: 246 10*3/uL (ref 150–400)
Platelets: 267 10*3/uL (ref 150–400)
RBC: 3.74 MIL/uL — ABNORMAL LOW (ref 3.87–5.11)
RBC: 3.89 MIL/uL (ref 3.87–5.11)
RDW: 16.3 % — ABNORMAL HIGH (ref 11.5–15.5)
RDW: 16.5 % — ABNORMAL HIGH (ref 11.5–15.5)
WBC: 17 10*3/uL — ABNORMAL HIGH (ref 4.0–10.5)
WBC: 6.5 10*3/uL (ref 4.0–10.5)
nRBC: 0 % (ref 0.0–0.2)
nRBC: 0.6 % — ABNORMAL HIGH (ref 0.0–0.2)

## 2019-01-24 LAB — URINALYSIS, ROUTINE W REFLEX MICROSCOPIC
Bilirubin Urine: NEGATIVE
Glucose, UA: 50 mg/dL — AB
Hgb urine dipstick: NEGATIVE
Ketones, ur: NEGATIVE mg/dL
Nitrite: NEGATIVE
Protein, ur: 100 mg/dL — AB
Specific Gravity, Urine: 1.005 (ref 1.005–1.030)
pH: 6 (ref 5.0–8.0)

## 2019-01-24 LAB — PROTEIN / CREATININE RATIO, URINE
Creatinine, Urine: 24.06 mg/dL
Protein Creatinine Ratio: 3.95 mg/mg{Cre} — ABNORMAL HIGH (ref 0.00–0.15)
Total Protein, Urine: 95 mg/dL

## 2019-01-24 LAB — TYPE AND SCREEN
ABO/RH(D): O POS
Antibody Screen: NEGATIVE

## 2019-01-24 LAB — COMPREHENSIVE METABOLIC PANEL
ALT: 29 U/L (ref 0–44)
AST: 37 U/L (ref 15–41)
Albumin: 2.5 g/dL — ABNORMAL LOW (ref 3.5–5.0)
Alkaline Phosphatase: 96 U/L (ref 38–126)
Anion gap: 9 (ref 5–15)
BUN: 6 mg/dL (ref 6–20)
CO2: 20 mmol/L — ABNORMAL LOW (ref 22–32)
Calcium: 8.6 mg/dL — ABNORMAL LOW (ref 8.9–10.3)
Chloride: 107 mmol/L (ref 98–111)
Creatinine, Ser: 0.61 mg/dL (ref 0.44–1.00)
GFR calc Af Amer: >60 mL/min (ref >60)
GFR calc non Af Amer: >60 mL/min (ref >60)
Glucose, Bld: 85 mg/dL (ref 70–99)
Potassium: 4 mmol/L (ref 3.5–5.1)
Sodium: 136 mmol/L (ref 135–145)
Total Bilirubin: 0.3 mg/dL (ref 0.3–1.2)
Total Protein: 5.8 g/dL — ABNORMAL LOW (ref 6.5–8.1)

## 2019-01-24 LAB — GLUCOSE, CAPILLARY: Glucose-Capillary: 97 mg/dL (ref 70–99)

## 2019-01-24 LAB — ABO/RH: ABO/RH(D): O POS

## 2019-01-24 LAB — SARS CORONAVIRUS 2 BY RT PCR (HOSPITAL ORDER, PERFORMED IN ~~LOC~~ HOSPITAL LAB): SARS Coronavirus 2: NEGATIVE

## 2019-01-24 SURGERY — Surgical Case
Anesthesia: Spinal

## 2019-01-24 MED ORDER — WITCH HAZEL-GLYCERIN EX PADS: 1.0000 "application " | MEDICATED_PAD | CUTANEOUS | Status: DC | PRN

## 2019-01-24 MED ORDER — OXYTOCIN 40 UNITS IN NORMAL SALINE INFUSION - SIMPLE MED
INTRAVENOUS | Status: DC | PRN
  Administered 2019-01-24: 40 [IU] via INTRAVENOUS

## 2019-01-24 MED ORDER — OXYTOCIN 40 UNITS IN NORMAL SALINE INFUSION - SIMPLE MED
INTRAVENOUS | Status: AC
  Filled 2019-01-24: qty 1000

## 2019-01-24 MED ORDER — SODIUM CHLORIDE 0.9 % IV SOLN
INTRAVENOUS | Status: DC | PRN
  Administered 2019-01-24: 21:00:00 via INTRAVENOUS

## 2019-01-24 MED ORDER — OXYCODONE HCL 5 MG/5ML PO SOLN: 5.0000 mg | Freq: Once | ORAL | Status: DC | PRN

## 2019-01-24 MED ORDER — MENTHOL 3 MG MT LOZG: 1.0000 | LOZENGE | OROMUCOSAL | Status: DC | PRN

## 2019-01-24 MED ORDER — LABETALOL HCL 5 MG/ML IV SOLN: 40.0000 mg | INTRAVENOUS | Status: DC | PRN

## 2019-01-24 MED ORDER — ONDANSETRON HCL 4 MG/2ML IJ SOLN
INTRAMUSCULAR | Status: AC
  Filled 2019-01-24: qty 2

## 2019-01-24 MED ORDER — NALOXONE HCL 4 MG/10ML IJ SOLN
1.0000 ug/kg/h | INTRAVENOUS | Status: DC | PRN
  Filled 2019-01-24: qty 5

## 2019-01-24 MED ORDER — SCOPOLAMINE 1 MG/3DAYS TD PT72: 1.0000 | MEDICATED_PATCH | Freq: Once | TRANSDERMAL | Status: DC

## 2019-01-24 MED ORDER — SIMETHICONE 80 MG PO CHEW
80.0000 mg | CHEWABLE_TABLET | Freq: Three times a day (TID) | ORAL | Status: DC
  Administered 2019-01-25 – 2019-01-30 (×14): 80 mg via ORAL
  Filled 2019-01-24 (×15): qty 1

## 2019-01-24 MED ORDER — ONDANSETRON HCL 4 MG/2ML IJ SOLN: 4.0000 mg | Freq: Three times a day (TID) | INTRAMUSCULAR | Status: DC | PRN

## 2019-01-24 MED ORDER — METOCLOPRAMIDE HCL 5 MG/ML IJ SOLN
INTRAMUSCULAR | Status: AC
  Filled 2019-01-24: qty 2

## 2019-01-24 MED ORDER — DIPHENHYDRAMINE HCL 25 MG PO CAPS: 25.0000 mg | ORAL_CAPSULE | ORAL | Status: DC | PRN

## 2019-01-24 MED ORDER — MORPHINE SULFATE (PF) 0.5 MG/ML IJ SOLN
INTRAMUSCULAR | Status: DC | PRN
  Administered 2019-01-24: .15 mg via EPIDURAL

## 2019-01-24 MED ORDER — NALBUPHINE HCL 10 MG/ML IJ SOLN
5.0000 mg | Freq: Once | INTRAMUSCULAR | Status: DC | PRN
  Filled 2019-01-24: qty 0.5

## 2019-01-24 MED ORDER — DEXAMETHASONE SODIUM PHOSPHATE 4 MG/ML IJ SOLN
INTRAMUSCULAR | Status: DC | PRN
  Administered 2019-01-24: 10 mg via INTRAVENOUS

## 2019-01-24 MED ORDER — OXYCODONE HCL 5 MG PO TABS: 5.0000 mg | ORAL_TABLET | Freq: Once | ORAL | Status: DC | PRN

## 2019-01-24 MED ORDER — LACTATED RINGERS IV SOLN: INTRAVENOUS | Status: DC

## 2019-01-24 MED ORDER — FENTANYL CITRATE (PF) 100 MCG/2ML IJ SOLN
INTRAMUSCULAR | Status: AC
  Filled 2019-01-24: qty 2

## 2019-01-24 MED ORDER — LABETALOL HCL 5 MG/ML IV SOLN
40.0000 mg | INTRAVENOUS | Status: DC | PRN
  Administered 2019-01-24: 40 mg via INTRAVENOUS
  Filled 2019-01-24: qty 8

## 2019-01-24 MED ORDER — MORPHINE SULFATE (PF) 0.5 MG/ML IJ SOLN
INTRAMUSCULAR | Status: AC
  Filled 2019-01-24: qty 10

## 2019-01-24 MED ORDER — BUPIVACAINE IN DEXTROSE 0.75-8.25 % IT SOLN
INTRATHECAL | Status: DC | PRN
  Administered 2019-01-24: 1.6 mL via INTRATHECAL

## 2019-01-24 MED ORDER — FERROUS SULFATE 325 (65 FE) MG PO TABS: 325.0000 mg | ORAL_TABLET | Freq: Two times a day (BID) | ORAL | Status: DC

## 2019-01-24 MED ORDER — SODIUM CHLORIDE 0.9% FLUSH: 3.0000 mL | INTRAVENOUS | Status: DC | PRN

## 2019-01-24 MED ORDER — COCONUT OIL OIL
1.0000 "application " | TOPICAL_OIL | Status: DC | PRN
  Administered 2019-01-25: 1 via TOPICAL

## 2019-01-24 MED ORDER — SIMETHICONE 80 MG PO CHEW
80.0000 mg | CHEWABLE_TABLET | ORAL | Status: DC
  Administered 2019-01-25 – 2019-01-30 (×6): 80 mg via ORAL
  Filled 2019-01-24 (×6): qty 1

## 2019-01-24 MED ORDER — DIPHENHYDRAMINE HCL 25 MG PO CAPS: 25.0000 mg | ORAL_CAPSULE | Freq: Four times a day (QID) | ORAL | Status: DC | PRN

## 2019-01-24 MED ORDER — OXYCODONE HCL 5 MG PO TABS
5.0000 mg | ORAL_TABLET | ORAL | Status: DC | PRN
  Administered 2019-01-25 – 2019-01-27 (×3): 5 mg via ORAL
  Filled 2019-01-24 (×3): qty 1

## 2019-01-24 MED ORDER — LACTATED RINGERS IV SOLN
INTRAVENOUS | Status: DC
  Administered 2019-01-24 – 2019-01-25 (×4): via INTRAVENOUS

## 2019-01-24 MED ORDER — PANTOPRAZOLE SODIUM 40 MG PO TBEC
40.0000 mg | DELAYED_RELEASE_TABLET | Freq: Every day | ORAL | Status: DC
  Administered 2019-01-25 – 2019-01-30 (×6): 40 mg via ORAL
  Filled 2019-01-24 (×6): qty 1

## 2019-01-24 MED ORDER — TERBUTALINE SULFATE 1 MG/ML IJ SOLN
0.2500 mg | Freq: Once | INTRAMUSCULAR | Status: AC
  Administered 2019-01-24: 0.25 mg via SUBCUTANEOUS
  Filled 2019-01-24: qty 1

## 2019-01-24 MED ORDER — NALBUPHINE HCL 10 MG/ML IJ SOLN
5.0000 mg | INTRAMUSCULAR | Status: DC | PRN
  Filled 2019-01-24: qty 0.5

## 2019-01-24 MED ORDER — LABETALOL HCL 5 MG/ML IV SOLN: 80.0000 mg | INTRAVENOUS | Status: DC | PRN

## 2019-01-24 MED ORDER — HYDRALAZINE HCL 20 MG/ML IJ SOLN: 10.0000 mg | INTRAMUSCULAR | Status: DC | PRN

## 2019-01-24 MED ORDER — LABETALOL HCL 5 MG/ML IV SOLN
20.0000 mg | INTRAVENOUS | Status: DC | PRN
  Administered 2019-01-24: 16:00:00 20 mg via INTRAVENOUS
  Filled 2019-01-24: qty 4

## 2019-01-24 MED ORDER — DEXAMETHASONE SODIUM PHOSPHATE 4 MG/ML IJ SOLN
INTRAMUSCULAR | Status: AC
  Filled 2019-01-24: qty 1

## 2019-01-24 MED ORDER — METOCLOPRAMIDE HCL 5 MG/ML IJ SOLN
INTRAMUSCULAR | Status: DC | PRN
  Administered 2019-01-24: 10 mg via INTRAVENOUS

## 2019-01-24 MED ORDER — FAMOTIDINE IN NACL 20-0.9 MG/50ML-% IV SOLN
20.0000 mg | Freq: Once | INTRAVENOUS | Status: AC
  Administered 2019-01-24: 20 mg via INTRAVENOUS
  Filled 2019-01-24: qty 50

## 2019-01-24 MED ORDER — SIMETHICONE 80 MG PO CHEW
80.0000 mg | CHEWABLE_TABLET | ORAL | Status: DC | PRN
  Filled 2019-01-24: qty 1

## 2019-01-24 MED ORDER — ENOXAPARIN SODIUM 60 MG/0.6ML ~~LOC~~ SOLN
0.5000 mg/kg | SUBCUTANEOUS | Status: DC
  Administered 2019-01-25 – 2019-01-29 (×5): 45 mg via SUBCUTANEOUS
  Filled 2019-01-24 (×5): qty 0.6

## 2019-01-24 MED ORDER — DIBUCAINE (PERIANAL) 1 % EX OINT: 1.0000 "application " | TOPICAL_OINTMENT | CUTANEOUS | Status: DC | PRN

## 2019-01-24 MED ORDER — MEPERIDINE HCL 25 MG/ML IJ SOLN: 6.2500 mg | INTRAMUSCULAR | Status: DC | PRN

## 2019-01-24 MED ORDER — LABETALOL HCL 5 MG/ML IV SOLN: 20.0000 mg | INTRAVENOUS | Status: DC | PRN

## 2019-01-24 MED ORDER — SOD CITRATE-CITRIC ACID 500-334 MG/5ML PO SOLN
30.0000 mL | Freq: Once | ORAL | Status: AC
  Administered 2019-01-24: 20:00:00 30 mL via ORAL
  Filled 2019-01-24: qty 30

## 2019-01-24 MED ORDER — ONDANSETRON HCL 4 MG/2ML IJ SOLN
INTRAMUSCULAR | Status: DC | PRN
  Administered 2019-01-24: 4 mg via INTRAVENOUS

## 2019-01-24 MED ORDER — CEFAZOLIN SODIUM-DEXTROSE 2-4 GM/100ML-% IV SOLN
2.0000 g | INTRAVENOUS | Status: AC
  Administered 2019-01-24: 2 g via INTRAVENOUS
  Filled 2019-01-24: qty 100

## 2019-01-24 MED ORDER — TETANUS-DIPHTH-ACELL PERTUSSIS 5-2.5-18.5 LF-MCG/0.5 IM SUSP
0.5000 mL | Freq: Once | INTRAMUSCULAR | Status: DC
  Filled 2019-01-24: qty 0.5

## 2019-01-24 MED ORDER — FENTANYL CITRATE (PF) 100 MCG/2ML IJ SOLN
25.0000 ug | INTRAMUSCULAR | Status: DC | PRN
  Administered 2019-01-24: 50 ug via INTRAVENOUS

## 2019-01-24 MED ORDER — KETOROLAC TROMETHAMINE 30 MG/ML IJ SOLN
30.0000 mg | Freq: Four times a day (QID) | INTRAMUSCULAR | Status: AC
  Administered 2019-01-25 (×4): 30 mg via INTRAVENOUS
  Filled 2019-01-24 (×4): qty 1

## 2019-01-24 MED ORDER — ONDANSETRON HCL 4 MG/2ML IJ SOLN: 4.0000 mg | Freq: Once | INTRAMUSCULAR | Status: DC | PRN

## 2019-01-24 MED ORDER — MAGNESIUM SULFATE 40 GM/1000ML IV SOLN: 2.0000 g/h | INTRAVENOUS | Status: DC

## 2019-01-24 MED ORDER — PRENATAL MULTIVITAMIN CH
1.0000 | ORAL_TABLET | Freq: Every day | ORAL | Status: DC
  Administered 2019-01-25 – 2019-01-30 (×4): 1 via ORAL
  Filled 2019-01-24 (×5): qty 1

## 2019-01-24 MED ORDER — MAGNESIUM SULFATE BOLUS VIA INFUSION
4.0000 g | Freq: Once | INTRAVENOUS | Status: AC
  Administered 2019-01-24: 4 g via INTRAVENOUS
  Filled 2019-01-24: qty 1000

## 2019-01-24 MED ORDER — DIPHENHYDRAMINE HCL 50 MG/ML IJ SOLN: 12.5000 mg | INTRAMUSCULAR | Status: DC | PRN

## 2019-01-24 MED ORDER — SENNOSIDES-DOCUSATE SODIUM 8.6-50 MG PO TABS
2.0000 | ORAL_TABLET | Freq: Two times a day (BID) | ORAL | Status: DC
  Administered 2019-01-25 – 2019-01-30 (×11): 2 via ORAL
  Filled 2019-01-24 (×12): qty 2

## 2019-01-24 MED ORDER — MAGNESIUM SULFATE 40 GM/1000ML IV SOLN
2.0000 g/h | INTRAVENOUS | Status: AC
  Administered 2019-01-25: 2 g/h via INTRAVENOUS
  Filled 2019-01-24 (×2): qty 1000

## 2019-01-24 MED ORDER — NALOXONE HCL 0.4 MG/ML IJ SOLN: 0.4000 mg | INTRAMUSCULAR | Status: DC | PRN

## 2019-01-24 MED ORDER — IBUPROFEN 800 MG PO TABS
800.0000 mg | ORAL_TABLET | Freq: Four times a day (QID) | ORAL | Status: DC
  Administered 2019-01-26 – 2019-01-30 (×15): 800 mg via ORAL
  Filled 2019-01-24 (×16): qty 1

## 2019-01-24 MED ORDER — FENTANYL CITRATE (PF) 100 MCG/2ML IJ SOLN
INTRAMUSCULAR | Status: DC | PRN
  Administered 2019-01-24: 15 ug via INTRAVENOUS

## 2019-01-24 MED ORDER — ZOLPIDEM TARTRATE 5 MG PO TABS: 5.0000 mg | ORAL_TABLET | Freq: Every evening | ORAL | Status: DC | PRN

## 2019-01-24 MED ORDER — OXYTOCIN 40 UNITS IN NORMAL SALINE INFUSION - SIMPLE MED: 2.5000 [IU]/h | INTRAVENOUS | Status: AC

## 2019-01-24 SURGICAL SUPPLY — 44 items
ADH SKN CLS APL DERMABOND .7 (GAUZE/BANDAGES/DRESSINGS) ×1
ADH SKN CLS LQ APL DERMABOND (GAUZE/BANDAGES/DRESSINGS) ×1
CHLORAPREP W/TINT 26ML (MISCELLANEOUS) ×2 IMPLANT
CLAMP CORD UMBIL (MISCELLANEOUS) IMPLANT
CLOTH BEACON ORANGE TIMEOUT ST (SAFETY) ×2 IMPLANT
DERMABOND ADHESIVE PROPEN (GAUZE/BANDAGES/DRESSINGS) ×1
DERMABOND ADVANCED (GAUZE/BANDAGES/DRESSINGS) ×1
DERMABOND ADVANCED .7 DNX12 (GAUZE/BANDAGES/DRESSINGS) ×1 IMPLANT
DERMABOND ADVANCED .7 DNX6 (GAUZE/BANDAGES/DRESSINGS) IMPLANT
DRSG OPSITE POSTOP 4X10 (GAUZE/BANDAGES/DRESSINGS) ×2 IMPLANT
ELECT REM PT RETURN 9FT ADLT (ELECTROSURGICAL) ×2
ELECTRODE REM PT RTRN 9FT ADLT (ELECTROSURGICAL) ×1 IMPLANT
EXTRACTOR VACUUM BELL STYLE (SUCTIONS) ×1 IMPLANT
GAUZE SPONGE 4X4 12PLY STRL LF (GAUZE/BANDAGES/DRESSINGS) IMPLANT
GLOVE BIO SURGEON STRL SZ 6 (GLOVE) ×2 IMPLANT
GLOVE BIOGEL PI IND STRL 6.5 (GLOVE) ×1 IMPLANT
GLOVE BIOGEL PI IND STRL 7.0 (GLOVE) ×2 IMPLANT
GLOVE BIOGEL PI INDICATOR 6.5 (GLOVE) ×1
GLOVE BIOGEL PI INDICATOR 7.0 (GLOVE) ×2
GOWN STRL REUS W/TWL LRG LVL3 (GOWN DISPOSABLE) ×4 IMPLANT
KIT ABG SYR 3ML LUER SLIP (SYRINGE) IMPLANT
NDL HYPO 25X5/8 SAFETYGLIDE (NEEDLE) IMPLANT
NEEDLE HYPO 25X5/8 SAFETYGLIDE (NEEDLE) IMPLANT
NS IRRIG 1000ML POUR BTL (IV SOLUTION) ×2 IMPLANT
PACK C SECTION WH (CUSTOM PROCEDURE TRAY) ×2 IMPLANT
PAD ABD 8X10 STRL (GAUZE/BANDAGES/DRESSINGS) IMPLANT
PAD OB MATERNITY 4.3X12.25 (PERSONAL CARE ITEMS) ×2 IMPLANT
PENCIL SMOKE EVAC W/HOLSTER (ELECTROSURGICAL) ×2 IMPLANT
RETRACTOR TRAXI PANNICULUS (MISCELLANEOUS) IMPLANT
RTRCTR C-SECT PINK 25CM LRG (MISCELLANEOUS) IMPLANT
SUT PDS AB 0 CTX 36 PDP370T (SUTURE) IMPLANT
SUT PLAIN 2 0 (SUTURE)
SUT PLAIN 2 0 XLH (SUTURE) ×1 IMPLANT
SUT PLAIN ABS 2-0 CT1 27XMFL (SUTURE) IMPLANT
SUT VIC AB 0 CT1 36 (SUTURE) ×4 IMPLANT
SUT VIC AB 2-0 CT1 27 (SUTURE) ×4
SUT VIC AB 2-0 CT1 TAPERPNT 27 (SUTURE) ×1 IMPLANT
SUT VIC AB 3-0 SH 27 (SUTURE)
SUT VIC AB 3-0 SH 27X BRD (SUTURE) IMPLANT
SUT VIC AB 4-0 KS 27 (SUTURE) ×2 IMPLANT
TOWEL OR 17X24 6PK STRL BLUE (TOWEL DISPOSABLE) ×2 IMPLANT
TRAXI PANNICULUS RETRACTOR (MISCELLANEOUS) ×1
TRAY FOLEY W/BAG SLVR 14FR LF (SET/KITS/TRAYS/PACK) ×2 IMPLANT
WATER STERILE IRR 1000ML POUR (IV SOLUTION) ×3 IMPLANT

## 2019-01-24 NOTE — H&P (Addendum)
Katelyn Hinton is a 33 y.o. female presenting for evaluation of SR BP in office. Patient endorsing headache, LE edema and swelling of abdomen. Bps in office during NST ranged 160s-180s/100-110s.  Sent to MAU for labs and BP monitoring.  Otherwise, +FM, denies VB, LOF, painful ctx (only irr cramping per patient).   PNC c/b several factors 1) AMA - low risk panorama, female 2) GDM - 2000mg  metformin QD AM (3 of 4 3hr GTT abnl) 3) h/o csx x1 - @ 32wks for PreE, 33yo w/ autism 4) h/o gallbladder disease - cancelled pre-pregnancy surgery because of +UPT and onset of COVID; on omeprazole 5) Anemia   OB History    Gravida  2   Para  1   Term      Preterm  1   AB      Living  1     SAB      TAB      Ectopic      Multiple      Live Births  1          Past Medical History:  Diagnosis Date  . Anemia   . Gallstones 2020   was to have surgery 07/08/18  . Gestational diabetes   . Pregnancy induced hypertension   . Preterm labor    due to [facility]  . Seizures [facility])    age 38mon, 1 time thing   Past Surgical History:  Procedure Laterality Date  . CESAREAN SECTION N/A 06/09/2013   Procedure: CESAREAN SECTION;  Surgeon: 06/11/2013, MD;  Location: [facility];  Service: Obstetrics;  Laterality: N/A;  . EYE SURGERY     Family History: family history includes Arthritis in her father; Asthma in her mother; Diabetes in her father; Hypertension in her father and mother. Social History:  reports that she has never smoked. She has never used smokeless tobacco. She reports that she does not drink alcohol or use drugs.     Maternal Diabetes: Yes:  Diabetes Type:  Insulin/Medication controlled Genetic Screening: Normal Maternal Ultrasounds/Referrals: Normal Fetal Ultrasounds or other Referrals:  None Maternal Substance Abuse:  No Significant Maternal Medications:  Meds include: Other: Metformin, omeprazole Significant Maternal Lab Results:  Group B Strep negative Other Comments:   None  Review of Systems  Constitutional: Negative for chills and fever.  Eyes: Negative for blurred vision.  Respiratory: Negative for cough and shortness of breath.   Cardiovascular: Negative for chest pain, palpitations and leg swelling.  Gastrointestinal: Positive for abdominal pain (lower abdomiunal pressure a/w edema in stomach). Negative for nausea and vomiting.  Musculoskeletal:       LE edema  Neurological: Positive for headaches. Negative for dizziness and weakness.  Psychiatric/Behavioral: Negative for suicidal ideas.   Maternal Medical History:  Reason for admission: Nausea. Severe range BP, PreE symptoms  Contractions: Frequency: irregular.   Perceived severity is mild.    Fetal activity: Perceived fetal activity is normal.    Prenatal complications: Pre-eclampsia (likely this visit; has history in G1).   Prenatal Complications - Diabetes: gestational. Diabetes is managed by oral agent (monotherapy).      Dilation: Closed Effacement (%): Thick Station: Ballotable Exam by:: V Rogers CNM Blood pressure (!) 153/83, pulse 82, temperature 98.9 F (37.2 C), resp. rate 18, height 5\' 1"  (1.549 m), weight 94.3 kg, last menstrual period 04/30/2018, SpO2 99 %, unknown if currently breastfeeding. Maternal Exam:  Abdomen: Surgical scars: low transverse.   Fetal presentation: vertex     Physical  Exam  Constitutional: She is oriented to person, place, and time. She appears well-developed and well-nourished. No distress.  HENT:  Head: Normocephalic and atraumatic.  Eyes: Pupils are equal, round, and reactive to light.  Neck: Normal range of motion. Neck supple.  Cardiovascular: Normal rate and regular rhythm. Exam reveals no gallop.  No murmur heard. GI: She exhibits distension (lower abdominal edema up to umbilicus). There is no abdominal tenderness. There is no rebound and no guarding.  Genitourinary:    Vagina and uterus normal.   Musculoskeletal: Normal range of  motion.        General: Edema: +2 to BL knee.  Neurological: She is alert and oriented to person, place, and time.  Skin: Skin is warm and dry.  Psychiatric:  Appears anxious    Prenatal labs: ABO, Rh: --/--/O POS, O POS Performed at [facility], [place] [address] [place], [place] [address]  [id number]) Antibody: NEG (12/01 1534) Rubella:   RPR: Non Reactive (05/08 1829)  HBsAg:    HIV: Non Reactive (05/08 1829)  GBS:   negative  Category 1 tracing with +accels, no decels, mod var. Toco q5-53m ctx, pt does not feel  BP range in MAU: 148-170/73-92. Last SR 164/87 @ 1631 PreE labs: Opos, 8.9/29.8/246, Cr 0.61, 37/29, uPC 3.95  Assessment/Plan: This is a 33yo G2P0101 @  38 3/7 by LMP c/w 13wks scan admitted now w/ PreE w/ SF based on Bps . PNC c/b h/o csx x1, GDMA2, AMA, h/o gallbladder disease, anemia.  ADMIT TO L&D AT THIS TIME  -PreE w/ SF: Initiate MgSO4 for seizure ppx, PreE labs as above. IV labetalol protocol PRN; last received IV Labetalol 40mg  at 1632.  NPO, anesthesia to see re spinal. @g  Ancef, will need Traxi. 2u pRBC ordered on call given anemia.   GDMA2: FSBS at this time   [name] 01/24/2019, 5:32 PM  ADDENDUM: R/B/A of cesarean section discussed with patient. Alternative would be vaginal delivery which would mean shorter postpartum stay and decreased risk of bleeding. Given patient's c/t/h cervix, being remote from delivery and presence of PreE w/ SF, would not want o further delay delivery to mechanically dilate cervix and then proceed with induction. Risks with TOLAC can include urgent or stat section w/ risk of rupture 1%. Risks of section include infection of the uterus, pelvic organs, or skin, inadvertent injury to internal organs, such as bowel or bladder. If there is major injury, extensive surgery may be required. If injury is minor, it may be treated with relative ease. Discussed possibility of excessive blood loss and transfusion. If bleeding  cannot be controlled using medical or minor surgical methods, a cesarean hysterectomy may be performed which would mean no future fertility. Patient accepts the possibility of blood transfusion, if necessary. Patient understands and agrees to move forward with section.

## 2019-01-24 NOTE — Progress Notes (Signed)
Dr. Wilhelmenia Blase notified Dr. Sabra Heck, anesthesiologist, regarding pt need for Cesarean Section.  Dr. Sabra Heck states may be scheduled for surgery  @ 2000. Dr. Wilhelmenia Blase gave report to Dr. Sabra Heck.

## 2019-01-24 NOTE — Progress Notes (Signed)
Darryll Capers, RN, House [employer], notified regarding pt's upcoming Cesarean @ 2000.

## 2019-01-24 NOTE — Progress Notes (Signed)
Notified of FHR decel at 1730 to 70s, resuscitative measures done (lateral, IVF) and SRTB of 120s. TOCO q4, review of strip shows this was late decel. 0.25mg  terbutaline IM ordered stat.   Tracing now category 1 with baseline 140bpm, min to mod var, no decels, consistent with initiation of MgSO4.

## 2019-01-24 NOTE — MAU Note (Signed)
Covid swab collected. Pt asymptomatic 

## 2019-01-24 NOTE — Anesthesia Preprocedure Evaluation (Addendum)
Anesthesia Evaluation  Patient identified by MRN, date of birth, ID band Patient awake    Reviewed: Allergy & Precautions, NPO status , Patient's Chart, lab work & pertinent test results  History of Anesthesia Complications Negative for: history of anesthetic complications  Airway Mallampati: II  TM Distance: >3 FB Neck ROM: Full    Dental   Pulmonary neg pulmonary ROS,    Pulmonary exam normal        Cardiovascular hypertension (pre-eclampsia on mag), Normal cardiovascular exam     Neuro/Psych negative neurological ROS  negative psych ROS   GI/Hepatic negative GI ROS, Neg liver ROS,   Endo/Other  diabetes, Gestational  Renal/GU negative Renal ROS  negative genitourinary   Musculoskeletal negative musculoskeletal ROS (+)   Abdominal   Peds  Hematology negative hematology ROS (+)   Anesthesia Other Findings   Reproductive/Obstetrics (+) Pregnancy                            Anesthesia Physical Anesthesia Plan  ASA: III  Anesthesia Plan: Spinal   Post-op Pain Management:    Induction:   PONV Risk Score and Plan: Ondansetron and Treatment may vary due to age or medical condition  Airway Management Planned: Natural Airway  Additional Equipment: None  Intra-op Plan:   Post-operative Plan:   Informed Consent: I have reviewed the patients History and Physical, chart, labs and discussed the procedure including the risks, benefits and alternatives for the proposed anesthesia with the patient or authorized representative who has indicated his/her understanding and acceptance.     Dental advisory given  Plan Discussed with:   Anesthesia Plan Comments:        Anesthesia Quick Evaluation

## 2019-01-24 NOTE — Anesthesia Procedure Notes (Signed)
Spinal  Patient location during procedure: OR Staffing Anesthesiologist: Edger Husain E, MD Performed: anesthesiologist  Preanesthetic Checklist Completed: patient identified, surgical consent, pre-op evaluation, timeout performed, IV checked, risks and benefits discussed and monitors and equipment checked Spinal Block Patient position: sitting Prep: site prepped and draped and DuraPrep Patient monitoring: continuous pulse ox, blood pressure and heart rate Approach: midline Location: L3-4 Injection technique: single-shot Needle Needle type: Pencan  Needle gauge: 24 G Needle length: 9 cm Additional Notes Functioning IV was confirmed and monitors were applied. Sterile prep and drape, including hand hygiene and sterile gloves were used. The patient was positioned and the spine was prepped. The skin was anesthetized with lidocaine.  Free flow of clear CSF was obtained prior to injecting local anesthetic into the CSF. The needle was carefully withdrawn. The patient tolerated the procedure well.      

## 2019-01-24 NOTE — Op Note (Addendum)
C-Section Operative Note  Date: 01/24/19  Preoperative Diagnosis: IUP @ 38 2/7; PreE w/ SF Postoperative Diagnosis:Same as above Indications: History of csx x1 Procedure: [facility]-ERLTCS Assistant: Dr Haskel Khan  Findings: Viable female infant weighing [phone] with APGARS of 8 and 9 at 1 and 5 minutes, respectively. Normal appearing uterus, bilateral fallopian tubes and ovaries.Cord gas of pH 7.2 Specimens: Placenta to pathology EBL 400cc IVF 1500cc UOP 400cc clear yellow   Patient Course: Patient is a now a [id number] initially sent to triage for evaluation of SR Bps in the office during antenatal testing for Freer. Met criteria for PreE w/ severe features by persistent SR BP requiring IV labetalol pushes as well as uPC of >3.0 with impressive LE edema and HA. Reviewed RBA of TOLAC with patient whose CE was c/t/h. Patient declined TOLAC and moved for ERLTCS. MgSO4 was hung for seizure ppx preoperatively.   Consent:  R/B/A of cesarean section discussed with patient. Alternative would be vaginal delivery which would mean shorter postpartum stay and decreased risk of bleeding. Risks of section include infection of the uterus, pelvic organs, or skin, inadvertent injury to internal organs, such as bowel or bladder. If there is major injury, extensive surgery may be required. If injury is minor, it may be treated with relative ease. Discussed possibility of excessive blood loss and transfusion. If bleeding cannot be controlled using medical or minor surgical methods, a cesarean hysterectomy may be performed which would mean no future fertility. Patient accepts the possibility of blood transfusion, if necessary. Patient understands and agrees to move forward with section.   Operative Procedure: Patient was taken to the operating room where epidural anesthesia was found to be adequate by Allis clamp test. TRAXI placed on abdomen given BMI 39. She was prepped and draped in the normal sterile fashion in  the dorsal supine position with a leftward tilt. An appropriate time out was performed. A Pfannenstiel skin incision was then made with the scalpel and carried through to the underlying layer of fascia by sharp dissection and Bovie cautery. The fascia was nicked in the midline and the incision was extended laterally with Mayo scissors. Moderate scar tissue noted. The superior aspect of the incision was grasped Coker clamps and dissected off the underlying rectus muscles. In a similar fashion the inferior aspect was dissected off the rectus muscles. Rectus muscles were separated in the midline and the peritoneal cavity entered bluntly. The peritoneal incision was then extended both superiorly and inferiorly with careful attention to avoid both bowel and bladder. The Alexis self-retaining wound retractor was then placed within the incision and the lower uterine segment exposed. The bladder flap was developed with Metzenbaum scissors and pushed away from the lower uterine segment. The lower uterine segment was then incised in a low transverse fashion and the cavity itself entered bluntly. The incision was extended bluntly. Amniotic sac was ruptured and fluid was noted to be clear in color. The infant's head was then lifted and attempts were made to deliver from the incision using the standard movements. Hysterotomy extended in curved fashion at right apex by 1cm using bandage scissors taking care to avoid vasculature. Still unable to deliver fetal head. At this time, mity Vac with bell called for. 3 pulls and two popoffs were noted, likely 2/2 copious fetal hair. On third pull, fetal head delivered. The remainder of the infant delivered and the nose and mouth bulb suctioned with the cord clamped and cut as well. The infant was handed off to  NICU. Time from hysterotomy to delivery of head was 3 min.The placenta was then spontaneously expressed from the uterus and the uterus cleared of all clots and debris with moist lap  sponge. Cord gas obtained. The uterine incision was then repaired in 2 layers the first layer was a running locked layer of 0-vicryl and the second an imbricating layer of the same suture. The tubes and ovaries were inspected and the gutters cleared of all clots and debris. The uterine incision was inspected and found to be hemostatic. All instruments and sponges as well as the Alexis retractor were then removed from the abdomen. The peritoneum was then reapproximated using 2-0 vicryl suture. The fascia was then closed with 0 Vicryl in a running fashion. Subcutaneous tissue was reapproximated with 2-0 plain in a running fashion. The skin was closed with a subcuticular stitch of 4-0 Vicryl on a Keith needle and then reinforced with Dermabond and a Honeycomb dressing. At the conclusion of the procedure all instruments and sponge counts were correct. Patient was taken to the recovery room in good condition. Baby girl is being transitioned in the NICU at this time.

## 2019-01-24 NOTE — Progress Notes (Signed)
Elaina Hoops, RN [facility] charge nurse, notified about pt and pending Cesarean.

## 2019-01-24 NOTE — Anesthesia Postprocedure Evaluation (Signed)
Anesthesia Post Note  Patient: Katelyn Hinton  Procedure(s) Performed: CESAREAN SECTION (N/A )     Patient location during evaluation: PACU Anesthesia Type: Spinal Level of consciousness: oriented and awake and alert Pain management: pain level controlled Vital Signs Assessment: post-procedure vital signs reviewed and stable Respiratory status: spontaneous breathing, respiratory function stable and nonlabored ventilation Cardiovascular status: blood pressure returned to baseline and stable Postop Assessment: no headache, no backache, no apparent nausea or vomiting and spinal receding Anesthetic complications: no    Last Vitals:  Vitals:   01/24/19 2230 01/24/19 2245  BP: (!) 146/88 (!) 154/95  Pulse: 79 69  Resp: (!) 25 17  Temp: 36.6 C   SpO2: 100% 97%    Last Pain:  Vitals:   01/24/19 2245  TempSrc:   PainSc: 5    Pain Goal:    LLE Motor Response: Purposeful movement (01/24/19 2245) LLE Sensation: Tingling (01/24/19 2245) RLE Motor Response: Purposeful movement (01/24/19 2245) RLE Sensation: Tingling (01/24/19 2245)     Epidural/Spinal Function Cutaneous sensation: Able to Discern Pressure (01/24/19 2245), Patient able to flex knees: Yes (01/24/19 2245), Patient able to lift hips off bed: Yes (01/24/19 2245), Back pain beyond tenderness at insertion site: No (01/24/19 2245), Progressively worsening motor and/or sensory loss: No (01/24/19 2245), Bowel and/or bladder incontinence post epidural: No (01/24/19 2245)  Lidia Collum

## 2019-01-24 NOTE — Transfer of Care (Signed)
Immediate Anesthesia Transfer of Care Note  Patient: Katelyn Hinton  Procedure(s) Performed: CESAREAN SECTION (N/A )  Patient Location: PACU  Anesthesia Type:Spinal  Level of Consciousness: awake, alert , oriented and patient cooperative  Airway & Oxygen Therapy: Patient Spontanous Breathing  Post-op Assessment: Report given to RN and Post -op Vital signs reviewed and stable  Post vital signs: Reviewed and stable  Last Vitals:  Vitals Value Taken Time  BP 142/95 01/24/19 2156  Temp    Pulse 80 01/24/19 2158  Resp 20 01/24/19 2158  SpO2 99 % 01/24/19 2158  Vitals shown include unvalidated device data.  Last Pain:  Vitals:   01/24/19 1900  TempSrc: Oral  PainSc:          Complications: No apparent anesthesia complications

## 2019-01-24 NOTE — MAU Note (Signed)
.   Katelyn Hinton is a 33 y.o. at [redacted]w[redacted]d here in MAU reporting: she was sent over from the office for increase in blood pressure. Denies any pain. PREE with 1st pregnancy and was delivered at 32 weeks C/S LMP:  Onset of complaint: today Pain score: 0 Vitals:   01/24/19 1522  BP: (!) 166/81  Pulse: 87  Resp: 18  Temp: 98.9 F (37.2 C)     FHT:140 Lab orders placed from triage:

## 2019-01-24 NOTE — MAU Provider Note (Signed)
First Provider Initiated Contact with Patient 01/24/19 1535     RN called CNM d/t severe BP x2  PEC orders placed and focused order set with labetalol   At bedside to assess patient- patient reports HA that is on and off for the past 3 days. Has taken Tylenol with initial relief of HA but reports it returns.  Denies vision changes and RUQ pain. +FM.   Patient sent from office and reports severe range BP in office of 168/110  Chief Complaint  Patient presents with  . Hypertension    O:  Patient Vitals for the past 24 hrs: 01/24/19 1630 - - - - 100 % - -  01/24/19 1620 - - - - 100 % - -  01/24/19 1615 (!) 165/93 - 86 - 100 % - -  01/24/19 1610 - - - - 99 % - -  01/24/19 1600 (!) 156/78 - 88 - 100 % - -  01/24/19 1550 - - - - 100 % - -  01/24/19 1545 (!) 147/76 - 88 - 99 % - -  01/24/19 1543 - - - - 100 % - -  01/24/19 1531 (!) 170/92 - 90 - - - -  01/24/19 1522 (!) 166/81 98.9 F (37.2 C) 87 18 - - -   General: NAD Heart: Regular rate Lungs: Normal rate and effort Abd: Soft, NT, Gravid, S=D Extremities: +2 bilateral lower extremity edema  Neuro: 2+ deep tendon reflexes, No clonus Pelvic: NEFG, no bleeding or LOF.   Dilation: Closed Effacement (%): Thick Station: Ballotable Presentation: Vertex(confirmed by bedside US) Exam by:: Wende Bushy CNM   Bedside US performed to confirm presentation   Pt informed that the ultrasound is considered a limited OB ultrasound and is not intended to be a complete ultrasound exam.  Patient also informed that the ultrasound is not being completed with the intent of assessing for fetal or placental anomalies or any pelvic abnormalities.  Explained that the purpose of today's ultrasound is to assess for  presentation.  Patient acknowledges the purpose of the exam and the limitations of the study.  Vertex presentation  FHR: 140/moderate/+accels/ no decelerations  Toco: 3-5 minutes/ mild by palpation   Results for orders placed or performed  during the hospital encounter of 01/24/19 (from the past 24 hour(s))  CBC     Status: Abnormal   Collection Time: 01/24/19  3:33 PM  Result Value Ref Range   WBC 6.5 4.0 - 10.5 K/uL   RBC 3.74 (L) 3.87 - 5.11 MIL/uL   Hemoglobin 8.9 (L) 12.0 - 15.0 g/dL   HCT 29.8 (L) 36.0 - 46.0 %   MCV 79.7 (L) 80.0 - 100.0 fL   MCH 23.8 (L) 26.0 - 34.0 pg   MCHC 29.9 (L) 30.0 - 36.0 g/dL   RDW 16.5 (H) 11.5 - 15.5 %   Platelets 246 150 - 400 K/uL   nRBC 0.6 (H) 0.0 - 0.2 %  Comprehensive metabolic panel     Status: Abnormal   Collection Time: 01/24/19  3:33 PM  Result Value Ref Range   Sodium 136 135 - 145 mmol/L   Potassium 4.0 3.5 - 5.1 mmol/L   Chloride 107 98 - 111 mmol/L   CO2 20 (L) 22 - 32 mmol/L   Glucose, Bld 85 70 - 99 mg/dL   BUN 6 6 - 20 mg/dL   Creatinine, Ser 0.61 0.44 - 1.00 mg/dL   Calcium 8.6 (L) 8.9 - 10.3 mg/dL   Total Protein  5.8 (L) 6.5 - 8.1 g/dL   Albumin 2.5 (L) 3.5 - 5.0 g/dL   AST 37 15 - 41 U/L   ALT 29 0 - 44 U/L   Alkaline Phosphatase 96 38 - 126 U/L   Total Bilirubin 0.3 0.3 - 1.2 mg/dL   GFR calc non Af Amer >60 >60 mL/min   GFR calc Af Amer >60 >60 mL/min   Anion gap 9 5 - 15  Type and screen [facility]     Status: None   Collection Time: 01/24/19  3:34 PM  Result Value Ref Range   ABO/RH(D) O POS    Antibody Screen NEG    Sample Expiration      01/27/2019,2359 Performed at [facility], [address] [place], [place] [address]   ABO/Rh     Status: None   Collection Time: 01/24/19  3:34 PM  Result Value Ref Range   ABO/RH(D)      O POS Performed at [facility], [address] [place], [place] [address]   Protein / creatinine ratio, urine     Status: Abnormal   Collection Time: 01/24/19  4:22 PM  Result Value Ref Range   Creatinine, Urine 24.06 mg/dL   Total Protein, Urine 95 mg/dL   Protein Creatinine Ratio 3.95 (H) 0.00 - 0.15 mg/mg[Cre]  Urinalysis, Routine w reflex microscopic     Status: Abnormal    Collection Time: 01/24/19  4:22 PM  Result Value Ref Range   Color, Urine STRAW (A) YELLOW   APPearance CLEAR CLEAR   Specific Gravity, Urine 1.005 1.005 - 1.030   pH 6.0 5.0 - 8.0   Glucose, UA 50 (A) NEGATIVE mg/dL   Hgb urine dipstick NEGATIVE NEGATIVE   Bilirubin Urine NEGATIVE NEGATIVE   Ketones, ur NEGATIVE NEGATIVE mg/dL   Protein, ur 098100 (A) NEGATIVE mg/dL   Nitrite NEGATIVE NEGATIVE   Leukocytes,Ua TRACE (A) NEGATIVE   RBC / HPF 0-5 0 - 5 RBC/hpf   WBC, UA 6-10 0 - 5 WBC/hpf   Bacteria, UA RARE (A) NONE SEEN   Squamous Epithelial / LPF 0-5 0 - 5  SARS Coronavirus 2 by RT PCR (hospital order, performed in [facility] hospital lab) Nasopharyngeal Nasopharyngeal Swab     Status: None   Collection Time: 01/24/19  4:41 PM   Specimen: Nasopharyngeal Swab  Result Value Ref Range   SARS Coronavirus 2 NEGATIVE NEGATIVE   BP down to 147/76 after 20mcg of labetalol   Meds ordered this encounter  Medications  . AND Linked Order Group   . labetalol (NORMODYNE) injection 20 mg   . labetalol (NORMODYNE) injection 40 mg   . labetalol (NORMODYNE) injection 80 mg  . lactated ringers infusion  . AND Linked Order Group   . labetalol (NORMODYNE) injection 20 mg   . labetalol (NORMODYNE) injection 40 mg   . labetalol (NORMODYNE) injection 80 mg   . hydrALAZINE (APRESOLINE) injection 10 mg  . magnesium bolus via infusion 4 g  . magnesium sulfate 40 grams in SWI 1000 mL OB infusion  . sodium citrate-citric acid (ORACIT) solution 30 mL  . famotidine (PEPCID) IVPB 20 mg premix  . ceFAZolin (ANCEF) IVPB 2g/100 mL premix    Order Specific Question:   Indication:    Answer:   Surgical Prophylaxis  . terbutaline (BRETHINE) injection 0.25 mg      A: 3421w3d week IUP Preeclampsia with severe features  NST reactive   P: Admission  for delivery  Patient prepared for repeat C/S Care taken over by Dr Reina Fuse - at bedside to discuss with patient   Mcneil Sober 01/24/2019 4:52  PM

## 2019-01-25 ENCOUNTER — Encounter (HOSPITAL_COMMUNITY): Payer: Self-pay | Admitting: Obstetrics and Gynecology

## 2019-01-25 LAB — GLUCOSE, CAPILLARY: Glucose-Capillary: 87 mg/dL (ref 70–99)

## 2019-01-25 LAB — CBC
HCT: 24.9 % — ABNORMAL LOW (ref 36.0–46.0)
Hemoglobin: 7.6 g/dL — ABNORMAL LOW (ref 12.0–15.0)
MCH: 24.1 pg — ABNORMAL LOW (ref 26.0–34.0)
MCHC: 30.5 g/dL (ref 30.0–36.0)
MCV: 78.8 fL — ABNORMAL LOW (ref 80.0–100.0)
Platelets: 239 10*3/uL (ref 150–400)
RBC: 3.16 MIL/uL — ABNORMAL LOW (ref 3.87–5.11)
RDW: 16.3 % — ABNORMAL HIGH (ref 11.5–15.5)
WBC: 10.9 10*3/uL — ABNORMAL HIGH (ref 4.0–10.5)
nRBC: 0 % (ref 0.0–0.2)

## 2019-01-25 LAB — RPR: RPR Ser Ql: NONREACTIVE

## 2019-01-25 LAB — CREATININE, SERUM
Creatinine, Ser: 0.45 mg/dL (ref 0.44–1.00)
GFR calc Af Amer: >60 mL/min (ref >60)
GFR calc non Af Amer: >60 mL/min (ref >60)

## 2019-01-25 MED ORDER — METFORMIN HCL 500 MG PO TABS: 1000.0000 mg | ORAL_TABLET | Freq: Every day | ORAL | Status: DC

## 2019-01-25 MED ORDER — POLYSACCHARIDE IRON COMPLEX 150 MG PO CAPS
150.0000 mg | ORAL_CAPSULE | Freq: Every day | ORAL | Status: DC
  Administered 2019-01-25 – 2019-01-30 (×6): 150 mg via ORAL
  Filled 2019-01-25 (×6): qty 1

## 2019-01-25 NOTE — Progress Notes (Signed)
Patient screened out for psychosocial assessment since none of the following apply:  Psychosocial stressors documented in mother or baby's chart  Gestation less than 32 weeks  Code at delivery   Infant with anomalies Please contact the Clinical Social Worker if specific needs arise, by MOB's request, or if MOB scores greater than 9/yes to question 10 on Edinburgh Postpartum Depression Screen.  Kage Willmann, LCSW Clinical Social Worker [facility] Cell#: [phone]     

## 2019-01-25 NOTE — Progress Notes (Signed)
Subjective: Postpartum Day #1: Cesarean Delivery Patient reports incisional pain and tolerating PO.    Objective: Vital signs in last 24 hours: Temp:  [97.9 F (36.6 C)-98.9 F (37.2 C)] 98.7 F (37.1 C) (12/02 0817) Pulse Rate:  [69-96] 95 (12/02 0817) Resp:  [17-25] 19 (12/02 0817) BP: (133-170)/(63-98) 146/84 (12/02 0817) SpO2:  [96 %-100 %] 99 % (12/02 0817) Weight:  [94.3 kg] 94.3 kg (12/01 1728)  Physical Exam:  General: alert Lochia: appropriate Uterine Fundus: firm Incision: dressing C/D/I   Recent Labs    01/24/19 2341 01/25/19 0623  HGB 9.2* 7.6*  HCT 31.0* 24.9*    Assessment/Plan: Status post Cesarean section. Doing well postoperatively. On magnesium for severe preeclampsia, BP stable, will plan to d/c magnesium at 2200 tonight.  Will change FeSO4 to Niferex for easier dosing and easier on stomach especially post-op.  Had GDM, will d/c Metformin and CBG.  Blane Ohara Neiko Trivedi 01/25/2019, 8:40 AM

## 2019-01-25 NOTE — Lactation Note (Signed)
This note was copied from a baby's chart. Lactation Consultation Note  Patient Name: Katelyn Hinton [id number] Date: 01/25/2019 Reason for consult: Follow-up assessment;NICU baby;Early term 37-38.6wks  1912 - 1922 - I followed up with Katelyn Hinton. She was holding her manual pump upon entry and stating that it was not working properly. I helped her reposition the handle to the pump, which had popped off. I showed her how to use the pump without twisting the handle.   We reviewed her pumping schedule. Katelyn Hinton indicated that she is pumping using her DEBP every three hours. She is not seeing a lot of milk production with the pump at this time, and I educated her that it was normal to not see a lot of volume.   Katelyn Hinton has a Medela pump at home; she is aware of the pump resources available to her via [facility] as well.   I praised Katelyn Hinton for her commitment to pumping and encouraged her to continue. I previewed when to expect her milk volume to increase and encouraged her to provide any EBM to baby, if possible. Lactation to follow up tomorrow to check on her progress.  PLAN: Pump both breasts for 15 minutes using the initiate setting at least 8 times a day.  Feeding Feeding Type: Formula Nipple Type: Slow - flow   Interventions Interventions: Breast feeding basics reviewed;DEBP;Hand pump  Lactation Tools Discussed/Used Tools: Pump Breast pump type: Double-Electric Breast Pump;Manual Pump Review: Setup, frequency, and cleaning   Consult Status Consult Status: Follow-up Date: 01/26/19 Follow-up type: In-patient    Lenore Manner 01/25/2019, 7:55 PM

## 2019-01-25 NOTE — Lactation Note (Addendum)
This note was copied from a baby's chart. Lactation Consultation Note  Patient Name: Katelyn Hinton Date: 01/25/2019 Reason for consult: Initial assessment;Early term 37-38.6wks;NICU baby P2, 34 hour female infant, ETI in NICU. Mom with hx: pre-eclampsia, HTN and C/S delivery. Per mom, she will start using DEBP and infant is being formula fed Similac Advance with iron (Haal formula) mom is Muslim. Per mom, she is active on the [facility] program in [facility] and has DEBP at home. Mom is experienced with breastfeeding, she breastfed her 35 year old for 10 months but stopped due to low milk supply. LC discussed how to use DEBP, mom will pump both breast for 15 minutes on initial setting every 3 hours. Mom will hand express after using the DEBP to help with breast stimulation and induction.  Mom was using DEBP as LC left the room. Mom has bottle labels and understands that breast milk needs be taken to NICU with labels not later than 4 hours. Mom will call Nurse or [facility] services if she has any questions or concerns. Mom made aware of O/P services, breastfeeding support groups, community resources, and our phone # for post-discharge questions.  Maternal Data Formula Feeding for Exclusion: No Has patient been taught Hand Expression?: Yes(Mom has colostrum present both breast.) Does the patient have breastfeeding experience prior to this delivery?: Yes  Feeding Feeding Type: Formula Nipple Type: Slow - flow  LATCH Score                   Interventions Interventions: Breast feeding basics reviewed;Coconut oil;DEBP;Hand express  Lactation Tools Discussed/Used WIC Program: Yes Pump Review: Setup, frequency, and cleaning;Milk Storage Initiated by:: Vicente Serene, IBCLC Date initiated:: 01/25/19   Consult Status Consult Status: Follow-up Date: 01/25/19 Follow-up type: In-patient    Vicente Serene 01/25/2019, 5:08 AM

## 2019-01-26 LAB — SURGICAL PATHOLOGY

## 2019-01-26 NOTE — Progress Notes (Signed)
Subjective: Postpartum Day 2: Cesarean Delivery Patient reports tolerating PO and no problems voiding.  Pt states had mild HA last PM but resolved.  Ambulating without dizziness  Objective: Vital signs in last 24 hours: Temp:  [98 F (36.7 C)-98.8 F (37.1 C)] 98.8 F (37.1 C) (12/03 0358) Pulse Rate:  [93-107] 96 (12/03 0358) Resp:  [17-19] 18 (12/03 0358) BP: (137-148)/(60-92) 141/82 (12/03 0358) SpO2:  [97 %-100 %] 98 % (12/03 0358)  Physical Exam:  General: alert and cooperative Lochia: appropriate Uterine Fundus: firm Incision:C/D/I   Recent Labs    01/24/19 2341 01/25/19 0623  HGB 9.2* 7.6*  HCT 31.0* 24.9*    Assessment/Plan: D/c magnesium last PM and BP stable thus far on no meds.  D/w her may rebound today and will be observing to see if needs po medication Tolerating relative anemia well, on niferex Baby stable in NICU, managing low BS  Logan Bores 01/26/2019, 8:38 AM

## 2019-01-26 NOTE — Plan of Care (Signed)
  Problem: Activity: Goal: Will verbalize the importance of balancing activity with adequate rest periods Outcome: Completed/Met Goal: Ability to tolerate increased activity will improve Outcome: Completed/Met   Problem: Coping: Goal: Ability to identify and utilize available resources and services will improve Outcome: Completed/Met   Problem: Role Relationship: Goal: Ability to demonstrate positive interaction with newborn will improve Outcome: Completed/Met   Problem: Education: Goal: Knowledge of disease or condition will improve Outcome: Completed/Met Goal: Knowledge of the prescribed therapeutic regimen will improve Outcome: Completed/Met   Problem: Education: Goal: Knowledge of General Education information will improve Description: Including pain rating scale, medication(s)/side effects and non-pharmacologic comfort measures Outcome: Completed/Met   Problem: Health Behavior/Discharge Planning: Goal: Ability to manage health-related needs will improve Outcome: Completed/Met   Problem: Clinical Measurements: Goal: Respiratory complications will improve Outcome: Completed/Met Goal: Cardiovascular complication will be avoided Outcome: Completed/Met   Problem: Activity: Goal: Risk for activity intolerance will decrease Outcome: Completed/Met   Problem: Nutrition: Goal: Adequate nutrition will be maintained Outcome: Completed/Met   Problem: Coping: Goal: Level of anxiety will decrease Outcome: Completed/Met   Problem: Elimination: Goal: Will not experience complications related to bowel motility Outcome: Completed/Met Goal: Will not experience complications related to urinary retention Outcome: Completed/Met   Problem: Pain Managment: Goal: General experience of comfort will improve Outcome: Completed/Met   Problem: Safety: Goal: Ability to remain free from injury will improve Outcome: Completed/Met   Problem: Activity: Goal: Will verbalize the  importance of balancing activity with adequate rest periods Outcome: Completed/Met Goal: Ability to tolerate increased activity will improve Outcome: Completed/Met   Problem: Coping: Goal: Ability to identify and utilize available resources and services will improve Outcome: Completed/Met   Problem: Role Relationship: Goal: Ability to demonstrate positive interaction with newborn will improve Outcome: Completed/Met   Problem: Education: Goal: Knowledge of disease or condition will improve Outcome: Completed/Met Goal: Knowledge of the prescribed therapeutic regimen will improve Outcome: Completed/Met   Problem: Education: Goal: Knowledge of General Education information will improve Description: Including pain rating scale, medication(s)/side effects and non-pharmacologic comfort measures Outcome: Completed/Met   Problem: Health Behavior/Discharge Planning: Goal: Ability to manage health-related needs will improve Outcome: Completed/Met   Problem: Clinical Measurements: Goal: Respiratory complications will improve Outcome: Completed/Met Goal: Cardiovascular complication will be avoided Outcome: Completed/Met   Problem: Activity: Goal: Risk for activity intolerance will decrease Outcome: Completed/Met   Problem: Nutrition: Goal: Adequate nutrition will be maintained Outcome: Completed/Met   Problem: Coping: Goal: Level of anxiety will decrease Outcome: Completed/Met   Problem: Elimination: Goal: Will not experience complications related to bowel motility Outcome: Completed/Met Goal: Will not experience complications related to urinary retention Outcome: Completed/Met   Problem: Pain Managment: Goal: General experience of comfort will improve Outcome: Completed/Met   Problem: Safety: Goal: Ability to remain free from injury will improve Outcome: Completed/Met

## 2019-01-26 NOTE — Lactation Note (Signed)
This note was copied from a baby's chart. Lactation Consultation Note  Patient Name: Girl Seila Liston [id number] Date: 01/26/2019 Reason for consult: Follow-up assessment;NICU baby;Early term 37-38.6wks  LC in to visit with P2 Mom of ET infant in the NICU.  Mom sitting at side of bed and had just started pumping.  Mom concerned that she isn't seeing any milk when she pumps.  Talked with Mom while she was pumping, and Mom expressed 5 ml to take to her baby in the NICU.  Mom delighted.  Encouraged Mom to ask for baby to be STS when she is visiting.  Mom aware of lactation assistance available to her.  Encouraged to ask when baby can go to breast and have baby's RN call lactation for feeding assist.  Mom states she had low milk supply with her first child.  Reassured Mom that each baby, each situation is different.  Baby is being fed Halal formula in NICU, as family is Muslim.  Encouraged STS and double pumping often, or every 2-3 hrs.  Encouraged breast massage and hand expression as well.  Mom to use initiation setting until able to express 20 ml twice, and then will be assisted to use regular setting.  Mom has a DEBP ([place]) at home.  Mom aware of Medela Symphony pump in baby's room.    Interventions Interventions: Breast feeding basics reviewed;Skin to skin;Breast massage;Hand express;DEBP  Lactation Tools Discussed/Used Tools: Pump Breast pump type: Double-Electric Breast Pump   Consult Status Consult Status: Follow-up Date: 01/27/19 Follow-up type: In-patient    Broadus John 01/26/2019, 10:40 AM

## 2019-01-26 NOTE — Plan of Care (Signed)
  Problem: Activity: Goal: Will verbalize the importance of balancing activity with adequate rest periods Outcome: Completed/Met Goal: Ability to tolerate increased activity will improve Outcome: Completed/Met   Problem: Coping: Goal: Ability to identify and utilize available resources and services will improve Outcome: Completed/Met   Problem: Role Relationship: Goal: Ability to demonstrate positive interaction with newborn will improve Outcome: Completed/Met   Problem: Education: Goal: Knowledge of disease or condition will improve Outcome: Completed/Met Goal: Knowledge of the prescribed therapeutic regimen will improve Outcome: Completed/Met

## 2019-01-27 MED ORDER — NIFEDIPINE ER OSMOTIC RELEASE 30 MG PO TB24
30.0000 mg | ORAL_TABLET | Freq: Once | ORAL | Status: AC
  Administered 2019-01-27: 30 mg via ORAL
  Filled 2019-01-27: qty 1

## 2019-01-27 MED ORDER — NIFEDIPINE ER OSMOTIC RELEASE 30 MG PO TB24
30.0000 mg | ORAL_TABLET | Freq: Every day | ORAL | Status: DC
  Administered 2019-01-27: 30 mg via ORAL
  Filled 2019-01-27 (×2): qty 1

## 2019-01-27 MED ORDER — NIFEDIPINE ER OSMOTIC RELEASE 30 MG PO TB24
30.0000 mg | ORAL_TABLET | Freq: Every day | ORAL | Status: DC
  Administered 2019-01-28: 30 mg via ORAL

## 2019-01-27 NOTE — Plan of Care (Signed)
  Problem: Skin Integrity: Goal: Risk for impaired skin integrity will decrease Outcome: Progressing   

## 2019-01-27 NOTE — Lactation Note (Signed)
This note was copied from a baby's chart. Lactation Consultation Note:  Infant is 53 hours old. Mother reports that infant is being watched today for low blood sugars and if they are good she will be able to take infant home tomorrow.  Mother reports that she is pumping 20 ml. Her breast are filling. She was advised to do good breast massage and to hand express before and after pumping.  Advised mother to continue to pump every 2-3 hours for 15-20 mins.  Encouraged mother to do STS with infant . Mother reports that she is afraid to do STS with infant having an IV. LC offered assistance with STS and Latching infant while in the NICU. Encouraged mother to page for [facility] services. Suggested that she ask NICU nurse to assist her with STS.  Mother reports that she has a Medela tote bag pump at home.  Mother declines having any questions or concerns.   Mother is aware of all [place] services and community support.   Patient Name: Katelyn Hinton [id number]'Y Date: 01/27/2019 Reason for consult: Follow-up assessment;NICU baby;Early term 37-38.6wks   Maternal Data    Feeding Feeding Type: Formula Nipple Type: Nfant Slow Flow (purple)  LATCH Score                   Interventions Interventions: Breast massage;Hand express;DEBP  Lactation Tools Discussed/Used     Consult Status Consult Status: Follow-up Date: 01/28/19 Follow-up type: In-patient    Jess Barters [facility] 01/27/2019, 11:31 AM

## 2019-01-27 NOTE — Progress Notes (Signed)
Patient ID: Katelyn Hinton, female   DOB: May 01, 1985, 33 y.o.   MRN: [id number] Pt in NICU Chart reviewed:   Per nursing pt doing well with no complaints. Received procardia 30xl at 930am today  VS: BP at 8am 150/90, 107  A/P: Will return to see pt once back in her room.          Monitor BP over next 4 hours to see if better controlled. Will adjust medication as indicated.          S/P MgSO4         Anemia - stable on niferex; apparently asymptomatic         GDM - off metformin          Routine pp/post op care

## 2019-01-27 NOTE — Progress Notes (Signed)
Patient ID: Katelyn Hinton, female   DOB: 11-05-1985, 33 y.o.   MRN: [id number] Pt finally back from NICU. Reports HA 6/10.  VS: 160/105 GEN- NAD ABD - FF EXT - mild edema   A/P: PPD#3 s/p c/s, PReE         BP - uncontrolled; will give another dose of procardia 30xl now and recheck BP in 91mins: may need to do IV protocol if persistently elevated           Check BP q 4hrs            Continue care: niferex fro anemia

## 2019-01-28 MED ORDER — NIFEDIPINE ER OSMOTIC RELEASE 30 MG PO TB24
30.0000 mg | ORAL_TABLET | Freq: Two times a day (BID) | ORAL | Status: DC
  Administered 2019-01-29 – 2019-01-30 (×3): 30 mg via ORAL
  Filled 2019-01-28 (×3): qty 1

## 2019-01-28 NOTE — Lactation Note (Signed)
This note was copied from a baby's chart. Lactation Consultation Note  Patient Name: Katelyn Hinton DXIPJ'A Date: 01/28/2019 Reason for consult: Follow-up assessment;NICU baby;Early term 59-38.6wks  Assisted Mom in the NICU.  Baby to advance to breast ad lib!!! Last feeding was 90 ml of formula 4 hrs prior.  Mom prefers to exclusively breastfeed.  Pillow support provided.  Mom supporting her breast using cross cradle hold.  Transitional milk spraying out easily with hand expression.  Baby positioned across Mom's chest, and assisted with good head support from ear to ear.  Baby able to attain a deep latch easily.  She fed with regular swallows identified, without any discomfort felt.  Baby came off the right breast on her own, burped and then assisted Mom to use cradle hold.  Baby latched again with a deep latch.  Mom's arms support well during feeding.  Showed Mom how to do breast compression to help baby continue to suck/swallow.  Mom to talk gently to baby and she responds well with nutritive sucking.    Encouraged Mom to keep baby STS as much as possible. Offer breast with cues (reviewed cues with Mom) Using pillow support, position baby at breast height and wait for baby to open her mouth wide before she is brought to breast.   Mom shown how to unlatch baby by breaking the suction first.  Mom to pump after ever other breast feeding prn today.  Baby able to support Mom's milk supply with frequent breastfeeding, if baby is latching deeply.    Mom to ask for help prn.  Feeding Feeding Type: Breast Fed  LATCH Score Latch: Grasps breast easily, tongue down, lips flanged, rhythmical sucking.  Audible Swallowing: Spontaneous and intermittent  Type of Nipple: Everted at rest and after stimulation  Comfort (Breast/Nipple): Soft / non-tender  Hold (Positioning): Assistance needed to correctly position infant at breast and maintain latch.  LATCH Score:  9  Interventions Interventions: Breast feeding basics reviewed;Assisted with latch;Skin to skin;Breast massage;Hand express;Breast compression;Adjust position;DEBP;Position options;Support pillows  Lactation Tools Discussed/Used Tools: Pump Breast pump type: Double-Electric Breast Pump   Consult Status Consult Status: Follow-up Date: 01/29/19 Follow-up type: In-patient    Broadus John 01/28/2019, 12:47 PM

## 2019-01-28 NOTE — Progress Notes (Signed)
Patient ID: Katelyn Hinton, female   DOB: 05/21/1985, 33 y.o.   MRN: [id number] Pt doing well. States HA improved now she is awake but woke up with one overnight. She denies blurry vision. She is ambulating and tolerating diet. Lochia scant. Pain well controlled. Baby doing better in NICU- possible imminent discharge. VS - 118-136/75-95, 107 GEN - NAD ABD - dressing c/d/i, ff, mild distension EXT - mild edema, no homans  A/P: PPD#4 s/p c/s, PReE, Anemia         BP better controlled overnight since dose changed to procardia 30x; BID - monitor today. If stays stable x 24hrs consider discharge to home            Continue care: niferex for anemia            Abdominal binder

## 2019-01-28 NOTE — Progress Notes (Signed)
Patient ID: Katelyn Hinton, female   DOB: March 16, 1985, 33 y.o.   MRN: [id number] Chart check  VS: 136-150/91-92, 99  Continue current care

## 2019-01-28 NOTE — Lactation Note (Signed)
This note was copied from a baby's chart. Lactation Consultation Note  Patient Name: Katelyn Hinton [id number] Date: 01/28/2019 Reason for consult: Follow-up assessment;NICU baby;Early term 37-38.6wks  LC in to visit with P2 Mom.  Mom sitting on side of bed and was just finishing pumping.  Mom's milk coming to volume.  Mom instructed to pump using the regular setting as she is expressing over 20 ml per session.    Offered to assist with positioning and latching baby in the NICU.  Mom states she AND baby are supposed to be discharged tomorrow.   Mom to ask NICU nurse to call [facility] for assist.   Interventions Interventions: Breast feeding basics reviewed;Assisted with latch;Skin to skin;Breast massage;Hand express;DEBP  Lactation Tools Discussed/Used Tools: Pump Breast pump type: Double-Electric Breast Pump   Consult Status Consult Status: Follow-up Date: 01/28/19 Follow-up type: In-patient    Broadus John 01/28/2019, 9:35 AM

## 2019-01-29 MED ORDER — NIFEDIPINE ER OSMOTIC RELEASE 30 MG PO TB24
60.0000 mg | ORAL_TABLET | Freq: Every day | ORAL | Status: DC
  Administered 2019-01-30: 60 mg via ORAL
  Filled 2019-01-29: qty 2

## 2019-01-29 MED ORDER — NIFEDIPINE ER OSMOTIC RELEASE 30 MG PO TB24
30.0000 mg | ORAL_TABLET | Freq: Once | ORAL | Status: AC
  Administered 2019-01-29: 30 mg via ORAL
  Filled 2019-01-29: qty 1

## 2019-01-29 NOTE — Progress Notes (Signed)
   01/29/19 1307  Vital Signs  BP (!) 155/104  Pulse Rate (!) 104  Dr. Terri Piedra notified of above BP and recheck of 157/87.  ORders received.  Will continue to monitor patient.

## 2019-01-29 NOTE — Progress Notes (Signed)
Patient ID: Katelyn Hinton, female   DOB: 07-09-1985, 33 y.o.   MRN: [id number] Pt reports feels great. No HA or blurry vision. Baby in room with her - discharged from NICU. She is ambulating, voiding and tolerating PO. Lochia mild. Has no pain complaints.   VS - 144-146/91-96 overnight, 160/105 this am with 155/72 41mins later. At time of exam, BP 149/105  Pt received procardia 30xl 8mins before time of exam  GEN - NAD ABD - dressing c/d/i EXT - no homans  A/P:  PPD#5 s/p c/s, PReE, Anemia BP elevated this am. Will recheck in an hour; if still elevated will give another dose of procardia and change dosing to 60xl in am. If BP better controlled on recheck then pt needs to take her 30xl am dose earlier in am ( eg 7am).          Explained above to pt- questions answered          If BP controlled rest of day will consider discharge to home this pm

## 2019-01-29 NOTE — Lactation Note (Signed)
This note was copied from a baby's chart. Lactation Consultation Note  Patient Name: Katelyn Hinton [id number] Date: 01/29/2019 Reason for consult: Follow-up assessment;Early term 16-38.6wks Baby is 5 days/1% weight loss.  Baby was transferred from NICU to mothers room yesterday afternoon.  Mom states baby is latching well and just finished a feeding.  Baby is sleeping in mothers arms.  Mom formula fed during the night.  Breasts are soft this morning.  Instructed to breastfeed with cues.  Mom denies questions or concerns.  Reviewed lactation outpatient services and encouraged to call prn.  Maternal Data    Feeding Feeding Type: Bottle Fed - Formula Nipple Type: Slow - flow  LATCH Score                   Interventions    Lactation Tools Discussed/Used     Consult Status Consult Status: Complete Follow-up type: Call as needed    Ave Filter 01/29/2019, 8:25 AM

## 2019-01-29 NOTE — Progress Notes (Signed)
   01/29/19 1100  Vital Signs  BP (!) 154/100  Dr. Terri Piedra notified of above BP.  Orders received to give additional dose of  Procardia 30mg  XL.

## 2019-01-29 NOTE — Progress Notes (Signed)
Patient ID: Katelyn Hinton, female   DOB: [birth year], 33 y.o.   MRN: [id number]  Chart check  VS : 145/96 ( 145-158/96-101), 111  Plan : Repeat BP in an hour.            Current plan for procardia 60xl at 7am and 30mg  at 5pm.

## 2019-01-30 ENCOUNTER — Encounter (HOSPITAL_COMMUNITY): Payer: Self-pay | Admitting: *Deleted

## 2019-01-30 MED ORDER — NIFEDIPINE ER 60 MG PO TB24: 60.0000 mg | ORAL_TABLET | ORAL | 0 refills | Status: AC

## 2019-01-30 MED ORDER — BLOOD PRESSURE CUFF MISC: 1.0000 [IU] | Freq: Every day | 0 refills | Status: AC

## 2019-01-30 MED ORDER — NIFEDIPINE ER 30 MG PO TB24: 30.0000 mg | ORAL_TABLET | Freq: Every evening | ORAL | 0 refills | Status: AC

## 2019-01-30 MED ORDER — DOCUSATE SODIUM 100 MG PO CAPS: 100.0000 mg | ORAL_CAPSULE | Freq: Two times a day (BID) | ORAL | 0 refills | Status: AC | PRN

## 2019-01-30 MED ORDER — IBUPROFEN 800 MG PO TABS: 800.0000 mg | ORAL_TABLET | Freq: Three times a day (TID) | ORAL | 1 refills | Status: AC

## 2019-01-30 MED ORDER — POLYSACCHARIDE IRON COMPLEX 150 MG PO CAPS: 150.0000 mg | ORAL_CAPSULE | Freq: Every day | ORAL | 1 refills | Status: AC

## 2019-01-30 MED ORDER — NIFEDIPINE ER OSMOTIC RELEASE 30 MG PO TB24: 30.0000 mg | ORAL_TABLET | Freq: Every day | ORAL | Status: DC

## 2019-01-30 MED ORDER — OXYCODONE HCL 5 MG PO TABS: 5.0000 mg | ORAL_TABLET | Freq: Four times a day (QID) | ORAL | 0 refills | Status: AC | PRN

## 2019-01-30 NOTE — Progress Notes (Signed)
POSTPARTUM POSTOP PROGRESS NOTE  POD #6  Subjective:  No acute events overnight.  Pt denies problems with ambulating, voiding or po intake.  She denies nausea or vomiting.  Pain is well controlled.  She has had flatus. She has had bowel movement.  Lochia Minimal.  Breastfeeding baby girl upon entry to room. Denies PreE symptoms, diuresing well. Reviewed need for close monitoring pp including BP check and closely timed medication   Objective: Blood pressure (!) 143/92, pulse (!) 127, temperature 97.9 F (36.6 C), temperature source Oral, resp. rate 17, height 5\' 1"  (1.549 m), weight 94.3 kg, last menstrual period 04/30/2018, SpO2 100 %, unknown if currently breastfeeding.  Physical Exam:  General: alert, cooperative and no distress Lochia:normal flow Chest: CTAB Heart: RRR no m/r/g Abdomen: +BS, soft, nontender Uterine Fundus: firm, 2cm below umbilicus. Honeycomb dressing intact, neg drainage Extremities: trace edema (notable improved from preop), neg calf TTP BL, neg Homans BL  BP range 128-148/77-92  Assessment/Plan:  ASSESSMENT: Katelyn Hinton is a 33 y.o. [id number] s/p [facility] @ [redacted]w[redacted]d for PreE w/ SF, h/o csx x1. PNC c/b GDMA2, AMA, anemia, gallbladder disease, h/o PreE..   Meeting postoperative milestones. Elongated inpt postop 2/2 BP control. This seems improved with Nifedipine 60XL 0700 and 30XL 1700. Plan to monitor Bps this morning until noon. If non-severe w/ diastolics <374 and majority SBPs<150, will D/C today and bring back to clinic end of wk for BP check.  Patient requests BP cuff at home.    LOS: 6 days

## 2019-01-30 NOTE — Discharge Summary (Addendum)
OB Discharge Summary     Patient Name: Katelyn Hinton DOB: 1985-05-19 MRN: [id number]  Date of admission: 01/24/2019 Delivering MD: Eula Flax M   Date of discharge: 01/30/2019  Admitting diagnosis: HBP Intrauterine pregnancy: [redacted]w[redacted]d     Secondary diagnosis:  Active Problems:   Preeclampsia, severe, third trimester  Additional problems: H/o csx x1     Discharge diagnosis: Term Pregnancy Delivered, Preeclampsia (severe), GDM A2 and Anemia                                                                                                Post partum procedures:none  Complications: None  Hospital course:  Sceduled C/S   33 y.o. yo [id number] at [redacted]w[redacted]d was admitted to the hospital 01/24/2019 for scheduled cesarean section with the following indication:Elective Repeat and severe PreE; remote from delivery. Presented from office with severe raneg BP, gross proteinuria and significant edema. C/T/H cervix, reviewed TOLAC vs ERLTCS, pt elected for RTLCS.  Membrane Rupture Time/Date: 9:03 PM ,01/24/2019   Patient delivered a Viable infant.01/24/2019  Details of operation can be found in separate operative note.  Pateint had an uncomplicated postop course.  Required BP med modification postop to control Bps, ended up on Nifeidpine 60XL AM/30XL PM. She is ambulating, tolerating a regular diet, passing flatus, and urinating well. Patient is discharged home in stable condition on  01/30/19         Physical exam  Vitals:   01/30/19 0425 01/30/19 0922 01/30/19 0923 01/30/19 1103  BP: (!) 143/92 (!) 134/98 (!) 142/91 (!) 144/89  Pulse: (!) 127 (!) 119 100 (!) 128  Resp: 17  18   Temp: 97.9 F (36.6 C)  98.6 F (37 C)   TempSrc: Oral  Oral   SpO2: 100%  100%   Weight:      Height:       General: alert, cooperative and no distress Lochia: appropriate Uterine Fundus: firm Incision: Healing well with no significant drainage, No significant erythema, Dressing is clean, dry, and intact DVT  Evaluation: No evidence of DVT seen on physical exam. Negative Homan's sign. Labs: Lab Results  Component Value Date   WBC 10.9 (H) 01/25/2019   HGB 7.6 (L) 01/25/2019   HCT 24.9 (L) 01/25/2019   MCV 78.8 (L) 01/25/2019   PLT 239 01/25/2019   CMP Latest Ref Rng & Units 01/24/2019  Glucose 70 - 99 mg/dL -  BUN 6 - 20 mg/dL -  Creatinine 0.44 - 1.00 mg/dL 0.45  Sodium 135 - 145 mmol/L -  Potassium 3.5 - 5.1 mmol/L -  Chloride 98 - 111 mmol/L -  CO2 22 - 32 mmol/L -  Calcium 8.9 - 10.3 mg/dL -  Total Protein 6.5 - 8.1 g/dL -  Total Bilirubin 0.3 - 1.2 mg/dL -  Alkaline Phos 38 - 126 U/L -  AST 15 - 41 U/L -  ALT 0 - 44 U/L -    Discharge instruction: per After Visit Summary and "Baby and Me Booklet".  After visit meds:  Allergies as of 01/30/2019   No Known Allergies  Medication List    STOP taking these medications   acetaminophen 325 MG tablet Commonly known as: TYLENOL   ACETAMINOPHEN-CODEINE PO   metFORMIN 1000 MG (MOD) 24 hr tablet Commonly known as: GLUMETZA     TAKE these medications   Blood Pressure Cuff Misc 1 Units by Does not apply route daily.   docusate sodium 100 MG capsule Commonly known as: Colace Take 1 capsule (100 mg total) by mouth 2 (two) times daily as needed for mild constipation.   ibuprofen 800 MG tablet Commonly known as: ADVIL Take 1 tablet (800 mg total) by mouth 3 (three) times daily.   iron polysaccharides 150 MG capsule Commonly known as: NIFEREX Take 1 capsule (150 mg total) by mouth daily.   multivitamin-prenatal 27-0.8 MG Tabs tablet Take 1 tablet by mouth daily at 12 noon.   NIFEdipine 60 MG 24 hr tablet Commonly known as: ADALAT CC Take 1 tablet (60 mg total) by mouth every morning.   NIFEdipine 30 MG 24 hr tablet Commonly known as: ADALAT CC Take 1 tablet (30 mg total) by mouth every evening. At 5PM   omeprazole 20 MG capsule Commonly known as: PRILOSEC Take 1 capsule (20 mg total) by mouth daily.    oxyCODONE 5 MG immediate release tablet Commonly known as: Oxy IR/ROXICODONE Take 1 tablet (5 mg total) by mouth every 6 (six) hours as needed for severe pain.       Diet: low salt diet  Activity: Advance as tolerated. Pelvic rest for 6 weeks.   Outpatient follow up:2wk incision chekc, 6wk pp. BP check end of week Follow up Appt:No future appointments. Follow up Visit:No follow-ups on file.  Postpartum contraception: Undecided  Newborn Data: Live born female  Birth Weight: 7 lb 15.7 oz (3620 g) APGAR: 8, 9  Newborn Delivery   Birth date/time: 01/24/2019 21:01:00 Delivery type: C-Section, Low Transverse Trial of labor: No C-section categorization: Repeat      Baby Feeding: Breast Disposition:home with mother   01/30/2019 Carlisle Cater, MD

## 2019-01-30 NOTE — Lactation Note (Signed)
This note was copied from a baby's chart. Lactation Consultation Note  Patient Name: Katelyn Hinton [id number] Date: 01/30/2019   Infant has gained 85 g over the last 24 hours. Mom is offering breast & then offering a bottle of EBM or formula. Mom is getting 70 mL when she pumps. Mom says "everything is great."  Mom has a pump at home & is familiar with cleaning/sanitizing pump parts, bottle parts, etc. Mom's 1st infant was born at 50 weeks & was in the NICU for some time. She nursed that child for 10 months.   Mom seemed surprised to know that an infant can get more out of the breast than a pump can. I mentioned doing pre- & post-weights at a follow-up appt with a Lactation Consultant. She seemed interested in that so I sent a message to the outpatient clinic via Epic.   Mom has been using extra Nfant gold nipples since infant was in the NICU. I provided 2 additional ones. I spoke w/Becky Mattox, PT to see infant will need f/u outpatient. She will speak to SLP to determine need.   Matthias Hughs [facility] 01/30/2019, 8:29 AM

## 2019-01-30 NOTE — Progress Notes (Signed)
BP this AM ranged from 130s-140s/80s-90. Denies PreE symptoms, will discharge at this time with plans for close followup for BP check outpatient.

## 2019-02-06 ENCOUNTER — Inpatient Hospital Stay (HOSPITAL_COMMUNITY): Admit: 2019-02-06 | Payer: Medicaid Other | Admitting: Obstetrics and Gynecology

## 2019-03-06 ENCOUNTER — Ambulatory Visit: Payer: Medicaid Other | Attending: Internal Medicine

## 2019-03-06 DIAGNOSIS — Z20822 Contact with and (suspected) exposure to covid-19: Secondary | ICD-10-CM

## 2019-03-07 LAB — NOVEL CORONAVIRUS, NAA: SARS-CoV-2, NAA: DETECTED — AB

## 2020-02-20 IMAGING — US OBSTETRIC <14 WK ULTRASOUND
1 series · 15 of 28 positions shown · non-contrast
Comparison: None.

CLINICAL DATA: Vaginal bleeding, positive pregnancy test

EXAM:
OBSTETRIC <14 WK ULTRASOUND
TECHNIQUE: Transabdominal ultrasound was performed for evaluation of the
gestation as well as the maternal uterus and adnexal regions.

[Series 1: obstetric <14 wk ultrasound · 15 of 35 slices shown]
[im 1/35]
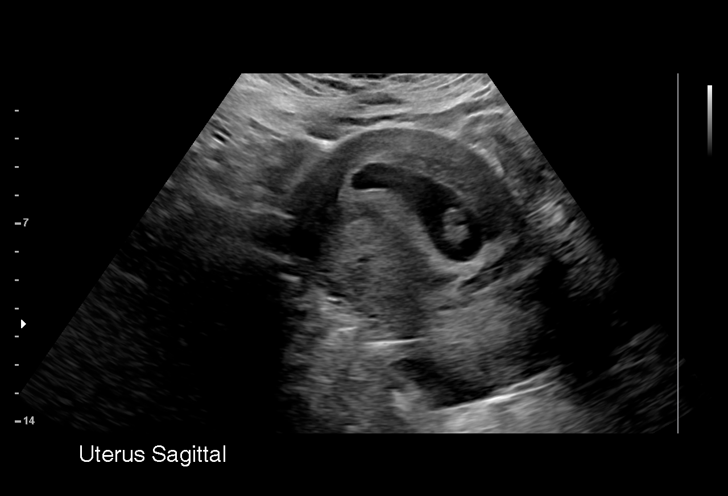
[im 3/35]
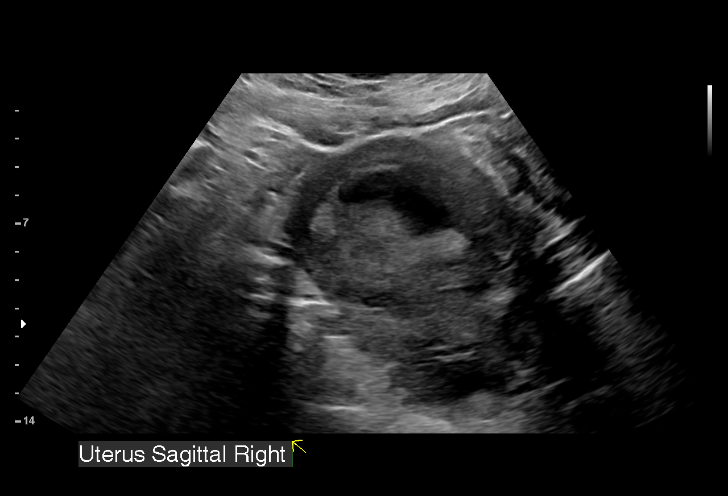
[im 6/35]
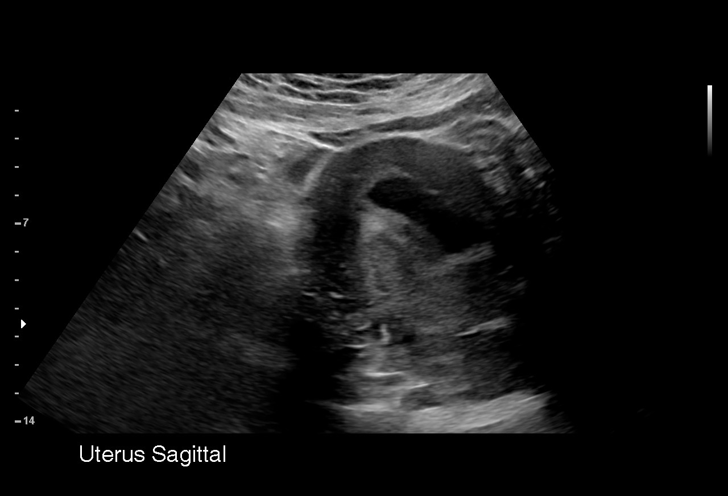
[im 8/35]
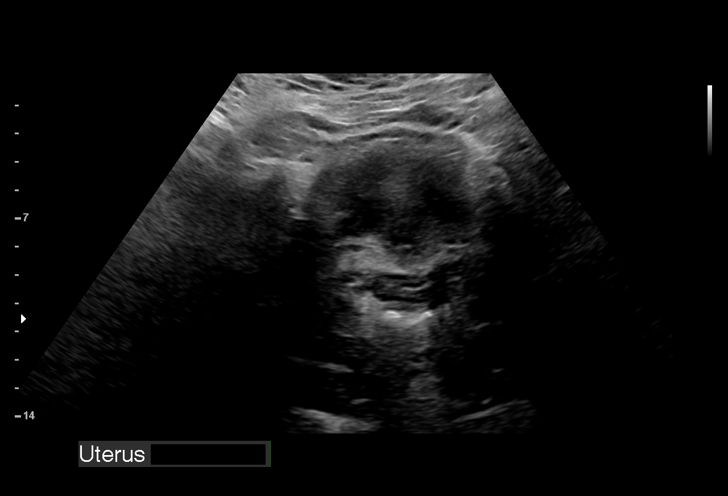
[im 11/35]
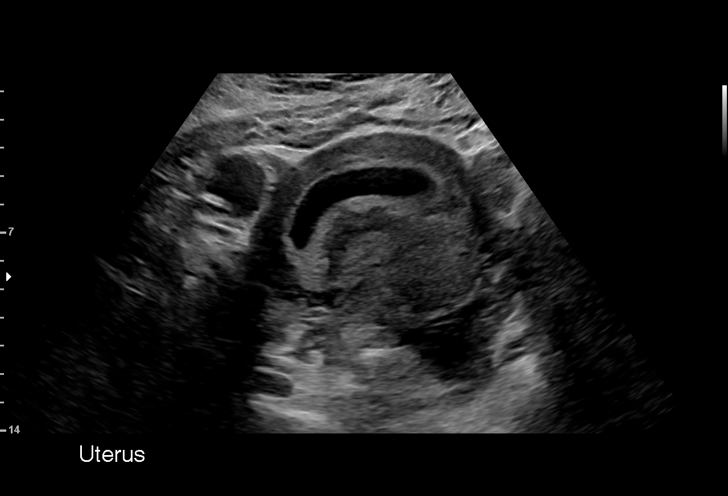
[im 13/35]
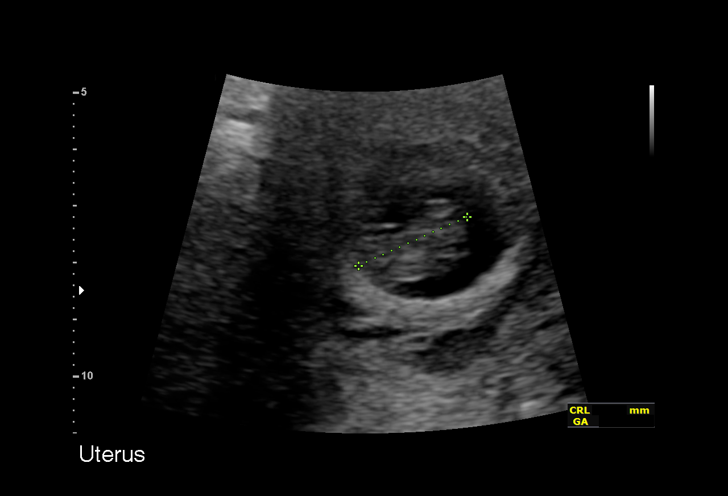
[im 16/35]
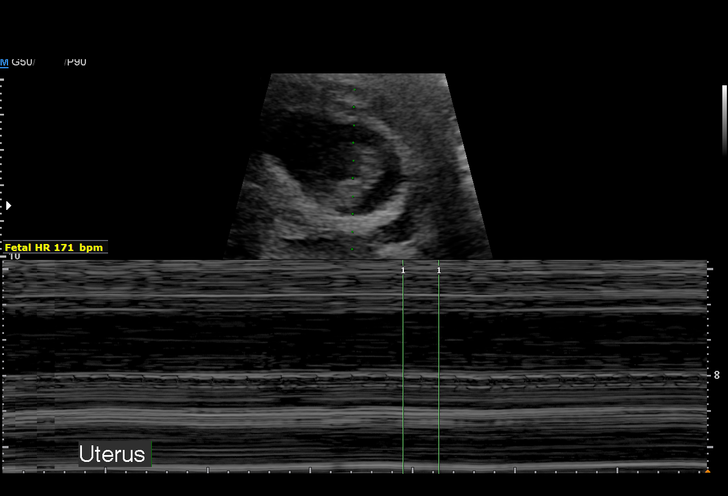
[im 18/35]
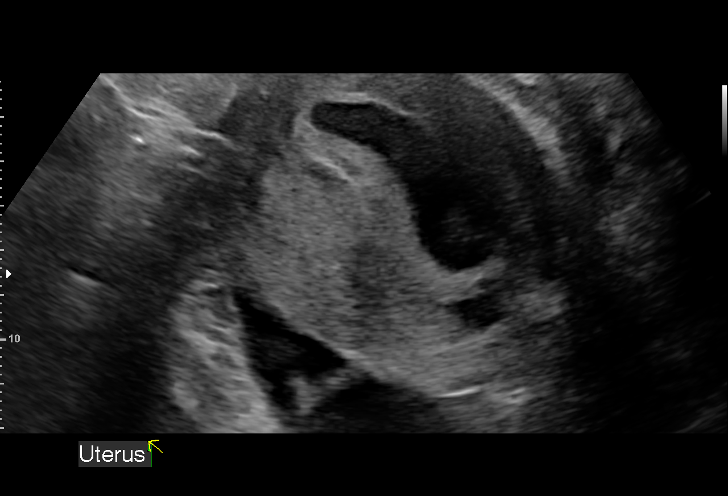
[im 19/35]
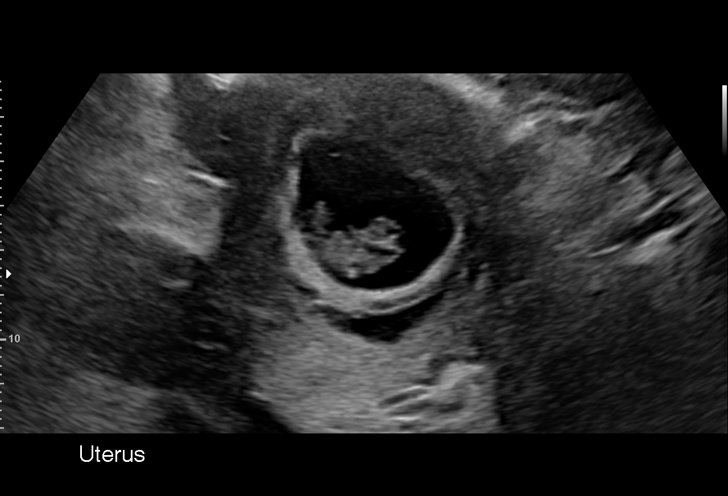
[im 22/35]
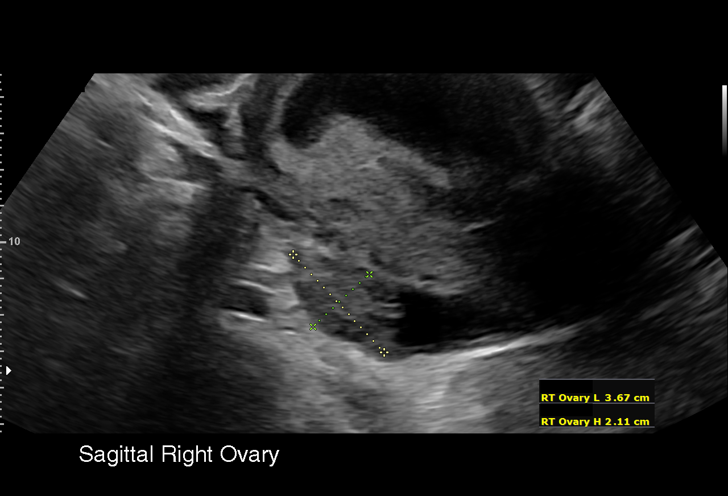
[im 24/35]
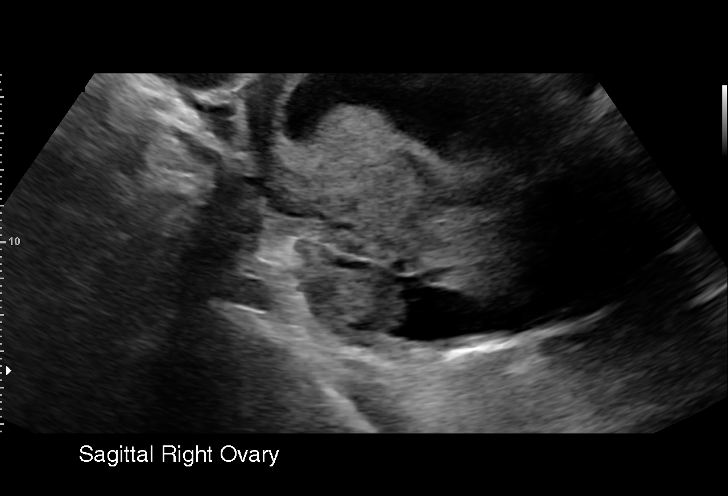
[im 27/35]
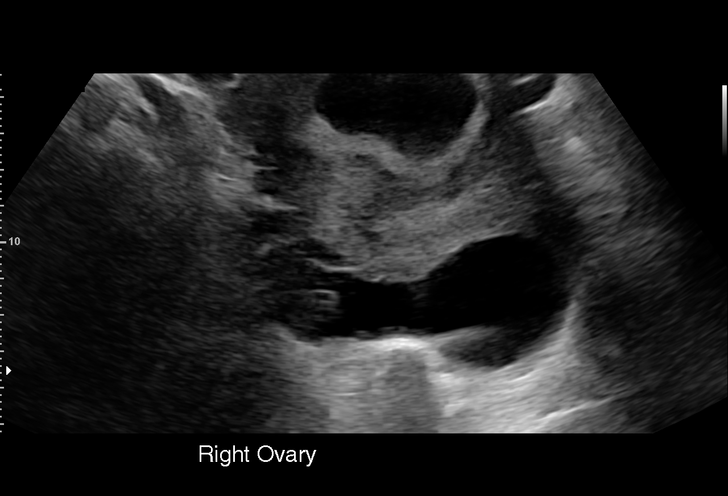
[im 29/35]
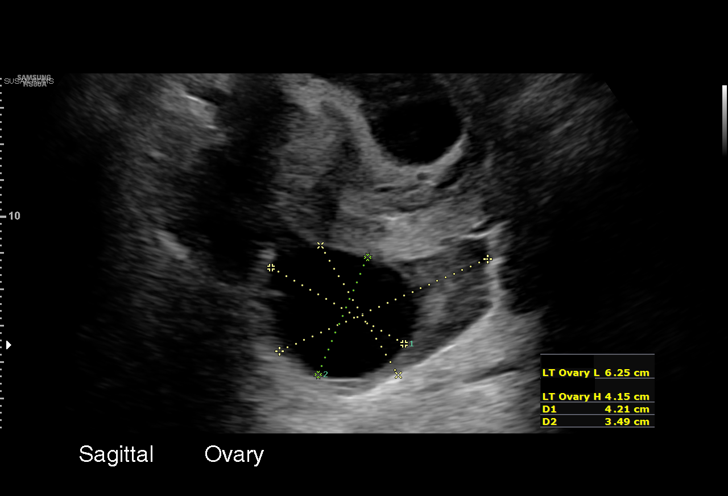
[im 32/35]
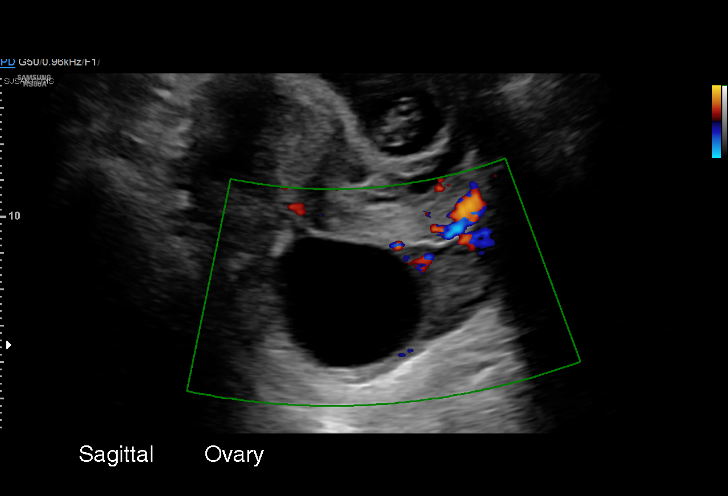
[im 35/35]
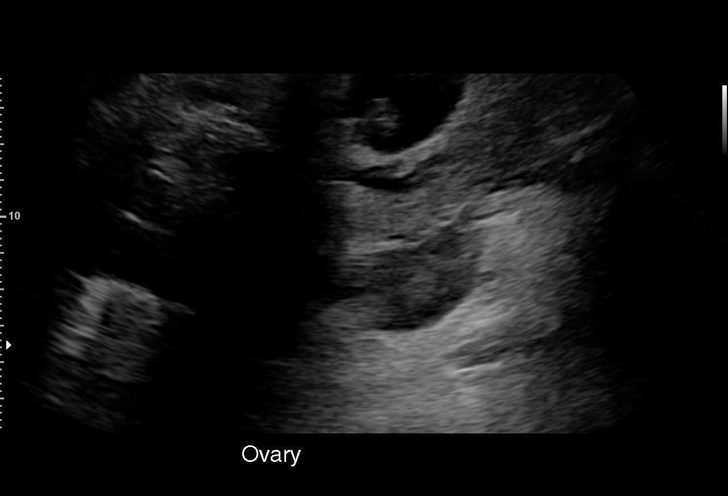

[15 of 28 positions shown; findings below may reference images not displayed]

FINDINGS: Intrauterine gestational sac: Present

Yolk sac:  Present

Embryo:  Present

Cardiac Activity: Present

Heart Rate: 171 bpm

CRL:   21 mm   8 w 4 d                  US EDC: 02/06/2019

Subchorionic hemorrhage:  Small subchorionic hemorrhage is noted.

Maternal uterus/adnexae: 4.2 cm cyst is noted in the left ovary.
IMPRESSION: Single live intrauterine gestation at 8 weeks 4 days. Small
subchorionic hemorrhage is noted.

Prominent left ovarian cyst is seen.
